# Patient Record
Sex: Female | Born: 1975 | Hispanic: No
Health system: Midwestern US, Community
[De-identification: ages and names within clinical notes are randomized; demographics above are authoritative.]

## PROBLEM LIST (undated history)

## (undated) DIAGNOSIS — F419 Anxiety disorder, unspecified: Secondary | ICD-10-CM

## (undated) DIAGNOSIS — E876 Hypokalemia: Secondary | ICD-10-CM

## (undated) DIAGNOSIS — R768 Other specified abnormal immunological findings in serum: Secondary | ICD-10-CM

## (undated) DIAGNOSIS — I10 Essential (primary) hypertension: Secondary | ICD-10-CM

## (undated) DIAGNOSIS — J0110 Acute frontal sinusitis, unspecified: Secondary | ICD-10-CM

## (undated) DIAGNOSIS — M62838 Other muscle spasm: Secondary | ICD-10-CM

## (undated) DIAGNOSIS — M25512 Pain in left shoulder: Secondary | ICD-10-CM

---

## 2016-01-13 ENCOUNTER — Ambulatory Visit: Admit: 2016-01-13 | Discharge: 2016-01-13 | Payer: PRIVATE HEALTH INSURANCE | Attending: Family Medicine

## 2016-01-13 DIAGNOSIS — F419 Anxiety disorder, unspecified: Secondary | ICD-10-CM

## 2016-01-13 MED ORDER — BUSPIRONE 7.5 MG TAB
7.5 mg | ORAL_TABLET | Freq: Two times a day (BID) | ORAL | 0 refills | Status: DC
Start: 2016-01-13 — End: 2016-08-05

## 2016-01-13 NOTE — Progress Notes (Signed)
Subjective:      Brandi Clark is a 40 y.o. female who presents for evaluation of anxiety.     Anxiety:  Patient is seen for anxiety. Treatment includes SSRI, Paxil, Lexapro and individual therapy.  Reports that she stopped taking her medication since she felt it was not helping.    Ongoing symptoms include: palpitations, sweating, intermittent chest pain, shortness of breath, dizziness, racing thoughts, difficulty concentrating.   She denies: suicidal ideation, homicidal ideation.   Reported side effects from the treatment: none.      ROS:  General/Constitutional:   No headache, fever, fatigue, weight loss or weight gain       Neck:   No swelling, masses, stiffness, pain, or limited movement     Cardiac:    As above   Respiratory:   As above  GI:   No nausea/vomiting, diarrhea, abdominal pain, bloody or dark stools       GU:   No dysuria or  hematuria    Neurological:   No loss of consciousness, dizziness, seizures    PMHx:  Past Medical History:   Diagnosis Date   ??? Anxiety        Meds:       Allergies:   Allergies not on file    Smoker:  History   Smoking Status   ??? Smoker, Current Status Unknown   ??? Packs/day: 1.00   Smokeless Tobacco   ??? Never Used       ETOH:   History   Alcohol Use No       FH:   Family History   Problem Relation Age of Onset   ??? Other Mother      fibroid tumor   ??? No Known Problems Father          Objective:     Visit Vitals   ??? BP (!) 145/97 (BP 1 Location: Left arm, BP Patient Position: Sitting)   ??? Pulse 83   ??? Temp 98.3 ??F (36.8 ??C) (Oral)   ??? Resp 17   ??? Ht 5' 6.61" (1.692 m)   ??? Wt 194 lb (88 kg)   ??? LMP 01/06/2016 (Approximate)   ??? SpO2 98%   ??? BMI 30.74 kg/m2       GEN: No apparent distress.     LUNGS: Respirations unlabored; clear to auscultation bilaterally  CARDIOVASCULAR: Regular, rate, and rhythm without murmurs, gallops or rubs   ABDOMEN: Soft; nontender; nondistended; normoactive bowel sounds; no masses or    organomegaly  SKIN: No obvious rashes.    NEUROLOGIC:  No focal neurologic deficits. Strength and sensation grossly intact.                           Coordination and gait grossly intact.   PSYCH: alert, oriented to person, place, and time, normal mood, behavior, speech, dress, motor activity, and thought processes    Assessment:     40 yo female who comes in for:     ICD-10-CM ICD-9-CM    1. Anxiety F41.9 300.00 busPIRone (BUSPAR) 7.5 mg tablet   2. Tobacco abuse Z72.0 305.1            Plan:   GAD7 score- 18    Anxiety  - Prescribed Buspar 7.5mg  BID  - Will have patient return to the clinic in 2 week  - Referral for cognitive behavioral therapy, stressed importance of making appointment (provided list of providers)  - Instructed  patient to contact office or on-call physician promptly should condition worsen or any new symptoms appear and provided on-call telephone numbers. IF THE PATIENT HAS ANY SUICIDAL OR HOMICIDAL IDEATION, CALL THE OFFICE, DISCUSS WITH A SUPPORT MEMBER OR GO TO THE ER IMMEDIATELY. Patient was agreeable with this plan    -BP elevated today. Likely secondary to anxiety. Counseled patient to keep log and bring to next visit.   Counseled on risks of tobacco abuse and advised patient to quit.     Patient is counseled to return to the office if symptoms do not improve as expected.  Urgent consultation with the nearest Emergency Department is strongly recommended if condition worsens.  Patient is counseled to follow up as recommended and to inform the office if any changes in treatment are recommended.      Patient discussed with Dr. Pryor Ochoa, attending physician        Signed By:  Leanora Ivanoff, MD    Family Medicine Resident

## 2016-01-13 NOTE — Patient Instructions (Signed)
Anxiety Disorder: Care Instructions  Your Care Instructions  Anxiety is a normal reaction to stress. Difficult situations can cause you to have symptoms such as sweaty palms and a nervous feeling.  In an anxiety disorder, the symptoms are far more severe. Constant worry, muscle tension, trouble sleeping, nausea and diarrhea, and other symptoms can make normal daily activities difficult or impossible. These symptoms may occur for no reason, and they can affect your work, school, or social life. Medicines, counseling, and self-care can all help.  Follow-up care is a key part of your treatment and safety. Be sure to make and go to all appointments, and call your doctor if you are having problems. It's also a good idea to know your test results and keep a list of the medicines you take.  How can you care for yourself at home?  ?? Take medicines exactly as directed. Call your doctor if you think you are having a problem with your medicine.  ?? Go to your counseling sessions and follow-up appointments.  ?? Recognize and accept your anxiety. Then, when you are in a situation that makes you anxious, say to yourself, "This is not an emergency. I feel uncomfortable, but I am not in danger. I can keep going even if I feel anxious."  ?? Be kind to your body:  ?? Relieve tension with exercise or a massage.  ?? Get enough rest.  ?? Avoid alcohol, caffeine, nicotine, and illegal drugs. They can increase your anxiety level and cause sleep problems.  ?? Learn and do relaxation techniques. See below for more about these techniques.  ?? Engage your mind. Get out and do something you enjoy. Go to a funny movie, or take a walk or hike. Plan your day. Having too much or too little to do can make you anxious.  ?? Keep a record of your symptoms. Discuss your fears with a good friend or family member, or join a support group for people with similar problems. Talking to others sometimes relieves stress.   ?? Get involved in social groups, or volunteer to help others. Being alone sometimes makes things seem worse than they are.  ?? Get at least 30 minutes of exercise on most days of the week to relieve stress. Walking is a good choice. You also may want to do other activities, such as running, swimming, cycling, or playing tennis or team sports.  Relaxation techniques  Do relaxation exercises 10 to 20 minutes a day. You can play soothing, relaxing music while you do them, if you wish.  ?? Tell others in your house that you are going to do your relaxation exercises. Ask them not to disturb you.  ?? Find a comfortable place, away from all distractions and noise.  ?? Lie down on your back, or sit with your back straight.  ?? Focus on your breathing. Make it slow and steady.  ?? Breathe in through your nose. Breathe out through either your nose or mouth.  ?? Breathe deeply, filling up the area between your navel and your rib cage. Breathe so that your belly goes up and down.  ?? Do not hold your breath.  ?? Breathe like this for 5 to 10 minutes. Notice the feeling of calmness throughout your whole body.  As you continue to breathe slowly and deeply, relax by doing the following for another 5 to 10 minutes:  ?? Tighten and relax each muscle group in your body. You can begin at your toes and work your   way up to your head.  ?? Imagine your muscle groups relaxing and becoming heavy.  ?? Empty your mind of all thoughts.  ?? Let yourself relax more and more deeply.  ?? Become aware of the state of calmness that surrounds you.  ?? When your relaxation time is over, you can bring yourself back to alertness by moving your fingers and toes and then your hands and feet and then stretching and moving your entire body. Sometimes people fall asleep during relaxation, but they usually wake up shortly afterward.  ?? Always give yourself time to return to full alertness before you drive a  car or do anything that might cause an accident if you are not fully alert. Never play a relaxation tape while you drive a car.  When should you call for help?  Call 911 anytime you think you may need emergency care. For example, call if:  ?? You feel you cannot stop from hurting yourself or someone else.  Keep the numbers for these national suicide hotlines: 1-800-273-TALK 709-465-8050) and 1-800-SUICIDE 234-278-4374). If you or someone you know talks about suicide or feeling hopeless, get help right away.  Watch closely for changes in your health, and be sure to contact your doctor if:  ?? You have anxiety or fear that affects your life.  ?? You have symptoms of anxiety that are new or different from those you had before.  Where can you learn more?  Go to [employer].  Enter P754 in the search box to learn more about "Anxiety Disorder: Care Instructions."  Current as of: January 08, 2015  Content Version: 11.3  ?? 2006-2017 [employer]. Care instructions adapted under license by [employer] (which disclaims liability or warranty for this information). If you have questions about a medical condition or this instruction, always ask your healthcare professional. [employer], Inc[employer] disclaims any warranty or liability for your use of this information.    [facility] Board:  [address] [place], [place] [address]  24 hour crisis line: [phone]  Daytime number: [phone]  Psychiatry, counseling, case management, drug/alcohol addiction    [facility] (Dr. Tami Ribas): [email or web address]  [address], [place], [place] [address]  Phone: [phone]) 897-WELL [phone])  Fax: [phone]  Office Hours:  Mon: 10:00 am to 4:00 pm  Tue: 8:00 am to 6:00 pm  Wed-Thur: 8:00 am to 7:00 pm    [facility]: [email or web address]  [place] [phone]),   [place] [phone])  [place] [phone])  [place] [phone])    [facility]  Addiction/Substance abuse   [place]: [phone]  [place]: [phone]    [facility]: [email or web address]  [address], [place], [place] [address]  [address], [place], [place] [address]  Ph. [phone] Fax [phone]    [facility]: [email or web address]  [address] [phone])  [address] [place] [phone])    [facility] - [address] [place]  [address]  [place], [address]  [place], [place] [address]  Phone: [phone]  Fax: [phone]    [facility] - [facility]  [address], [address]  [place], [place] [address]  Phone: [phone]  Fax: [phone]    Lawanna Kobus, M.D.  [place], [place]  [phone]    Rocky Link, M.D.  [place]  [phone]    [facility]  [address]  [place], [place]  [phone]  [phone]

## 2016-01-13 NOTE — Progress Notes (Signed)
I reviewed with the resident the medical history and the resident's findings on the physical examination.  I discussed with the resident the patient's diagnosis and concur with the plan.n as documented in the resident's note.

## 2016-02-04 ENCOUNTER — Encounter: Attending: Family Medicine

## 2016-07-31 ENCOUNTER — Ambulatory Visit: Admit: 2016-07-31 | Discharge: 2016-07-31 | Payer: PRIVATE HEALTH INSURANCE | Attending: Family Medicine

## 2016-07-31 DIAGNOSIS — F419 Anxiety disorder, unspecified: Secondary | ICD-10-CM

## 2016-07-31 MED ORDER — GABAPENTIN 100 MG CAP
100 mg | ORAL_CAPSULE | Freq: Three times a day (TID) | ORAL | 0 refills | Status: DC
Start: 2016-07-31 — End: 2016-08-14

## 2016-07-31 NOTE — Patient Instructions (Addendum)
Anxiety Disorder: Care Instructions  Your Care Instructions    Anxiety is a normal reaction to stress. Difficult situations can cause you to have symptoms such as sweaty palms and a nervous feeling.  In an anxiety disorder, the symptoms are far more severe. Constant worry, muscle tension, trouble sleeping, nausea and diarrhea, and other symptoms can make normal daily activities difficult or impossible. These symptoms may occur for no reason, and they can affect your work, school, or social life. Medicines, counseling, and self-care can all help.  Follow-up care is a key part of your treatment and safety. Be sure to make and go to all appointments, and call your doctor if you are having problems. It's also a good idea to know your test results and keep a list of the medicines you take.  How can you care for yourself at home?  ?? Take medicines exactly as directed. Call your doctor if you think you are having a problem with your medicine.  ?? Go to your counseling sessions and follow-up appointments.  ?? Recognize and accept your anxiety. Then, when you are in a situation that makes you anxious, say to yourself, "This is not an emergency. I feel uncomfortable, but I am not in danger. I can keep going even if I feel anxious."  ?? Be kind to your body:  ?? Relieve tension with exercise or a massage.  ?? Get enough rest.  ?? Avoid alcohol, caffeine, nicotine, and illegal drugs. They can increase your anxiety level and cause sleep problems.  ?? Learn and do relaxation techniques. See below for more about these techniques.  ?? Engage your mind. Get out and do something you enjoy. Go to a funny movie, or take a walk or hike. Plan your day. Having too much or too little to do can make you anxious.  ?? Keep a record of your symptoms. Discuss your fears with a good friend or family member, or join a support group for people with similar problems. Talking to others sometimes relieves stress.   ?? Get involved in social groups, or volunteer to help others. Being alone sometimes makes things seem worse than they are.  ?? Get at least 30 minutes of exercise on most days of the week to relieve stress. Walking is a good choice. You also may want to do other activities, such as running, swimming, cycling, or playing tennis or team sports.  Relaxation techniques  Do relaxation exercises 10 to 20 minutes a day. You can play soothing, relaxing music while you do them, if you wish.  ?? Tell others in your house that you are going to do your relaxation exercises. Ask them not to disturb you.  ?? Find a comfortable place, away from all distractions and noise.  ?? Lie down on your back, or sit with your back straight.  ?? Focus on your breathing. Make it slow and steady.  ?? Breathe in through your nose. Breathe out through either your nose or mouth.  ?? Breathe deeply, filling up the area between your navel and your rib cage. Breathe so that your belly goes up and down.  ?? Do not hold your breath.  ?? Breathe like this for 5 to 10 minutes. Notice the feeling of calmness throughout your whole body.  As you continue to breathe slowly and deeply, relax by doing the following for another 5 to 10 minutes:  ?? Tighten and relax each muscle group in your body. You can begin at your toes and   work your way up to your head.  ?? Imagine your muscle groups relaxing and becoming heavy.  ?? Empty your mind of all thoughts.  ?? Let yourself relax more and more deeply.  ?? Become aware of the state of calmness that surrounds you.  ?? When your relaxation time is over, you can bring yourself back to alertness by moving your fingers and toes and then your hands and feet and then stretching and moving your entire body. Sometimes people fall asleep during relaxation, but they usually wake up shortly afterward.  ?? Always give yourself time to return to full alertness before you drive a  car or do anything that might cause an accident if you are not fully alert. Never play a relaxation tape while you drive a car.  When should you call for help?  Call 911 anytime you think you may need emergency care. For example, call if:  ? ?? You feel you cannot stop from hurting yourself or someone else.   ?Keep the numbers for these national suicide hotlines: 1-800-273-TALK (1-800-273-8255) and 1-800-SUICIDE (1-800-784-2433). If you or someone you know talks about suicide or feeling hopeless, get help right away.  ?Watch closely for changes in your health, and be sure to contact your doctor if:  ? ?? You have anxiety or fear that affects your life.   ? ?? You have symptoms of anxiety that are new or different from those you had before.   Where can you learn more?  Go to http://www.healthwise.net/GoodHelpConnections.  Enter P754 in the search box to learn more about "Anxiety Disorder: Care Instructions."  Current as of: Oct 25, 2015  Content Version: 11.4  ?? 2006-2017 Healthwise, Inc[employer]. Care instructions adapted under license by [employer] (which disclaims liability or warranty for this information). If you have questions about a medical condition or this instruction, always ask your healthcare professional. Healthwise, Incorporated disclaims any warranty or liability for your use of this information.

## 2016-07-31 NOTE — Progress Notes (Signed)
1. Have you been to the ER, urgent care clinic, or been hospitalized since your last visit? Yes. [facility] 3 months ago, Sciatica    2. Have you seen or consulted any other health care providers outside [facility] since your last visit?  No     Reviewed record in preparation for visit and have necessary documentation  opportunity was given for questions  Goals that were addressed and/or need to be completed during or after this appointment include     Health Maintenance Due   Topic Date Due   ??? Pneumococcal 19-64 Medium Risk (1 of 1 - PPSV23) 07/23/1996   ??? DTaP/Tdap/Td series (1 - Tdap) 07/23/1998   ??? PAP AKA CERVICAL CYTOLOGY  07/23/1998   ??? Influenza Age 78 to Adult  01/14/2016

## 2016-07-31 NOTE — Progress Notes (Signed)
Brandi Clark  41 y.o. female  [birth year]  [address]  [place] [address]  [id number]     [facility]: Progress Note       Encounter Date: 07/31/2016    Chief Complaint   Patient presents with   ??? New Patient   ??? Labs     Hepatitis C       History provided by patient  History of Present Illness   Brandi Clark is a 41 y.o. female who presents to clinic today for:      NEW PATIENT  New patient who presents to establish PCP care.   I personally reviewed and updated the patient's medical, surgical, family and social history.    PREVIOUS PRIMARY CARE PROVIDER and SPECIALISTS  None. Patient decided to come to [facility] due to establish care. Has not seen a GP or GYN since the birth of daughter 14 years ago. Marland Kitchen     RECORDS  Provided by patient: none.     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 She is concerned that she may have hepatitis C as a number of people used to use drug with have been diagnosed.   - has hx of Scaitiac and as had taking Mortin 800 mg.   History of anxiety has tried Wellbutrin in the past (gave her shakes.)Took Lexapro for several months with no clinical effective.She has the bottle at home and will bring to next visit.      Health Maintenance  Patient to schedule a well woman visit.   Health Maintenance Due   Topic Date Due   ??? Pneumococcal 19-64 Medium Risk (1 of 1 - PPSV23) 07/23/1996   ??? DTaP/Tdap/Td series (1 - Tdap) 07/23/1998   ??? PAP AKA CERVICAL CYTOLOGY  07/23/1998   ??? Influenza Age 77 to Adult  01/14/2016     Review of Systems   Review of Systems   Constitutional: Negative for chills and fever.   HENT: Negative for congestion and sore throat.    Eyes: Negative for photophobia, discharge and redness.   Respiratory: Negative for cough, shortness of breath and wheezing.    Cardiovascular: Negative for chest pain, palpitations and leg swelling.   Gastrointestinal: Negative for abdominal pain, blood in stool,  constipation, diarrhea, nausea and vomiting.   Genitourinary: Negative for dysuria and urgency.   Musculoskeletal: Positive for joint pain. Negative for falls.   Skin: Negative for itching and rash.   Neurological: Positive for tingling. Negative for dizziness, focal weakness, seizures, loss of consciousness and headaches.   Psychiatric/Behavioral: Negative for depression, hallucinations and substance abuse. The patient is nervous/anxious. The patient does not have insomnia.        Vitals/Objective:     Vitals:    07/31/16 1048 07/31/16 1055   BP: (!) 163/100 (!) 143/98   Pulse: 77 79   Resp: 18    Temp: 98.1 ??F (36.7 ??C)    TempSrc: Oral    SpO2: 98%    Weight: 173 lb (78.5 kg)    Height: 5\' 6"  (1.676 m)      Body mass index is 27.92 kg/(m^2).    Wt Readings from Last 3 Encounters:   07/31/16 173 lb (78.5 kg)   01/13/16 194 lb (88 kg)       Physical Exam   Constitutional: She is oriented to person, place, and time. She  appears well-nourished. No distress.   HENT:   Head: Normocephalic and atraumatic.   Right Ear: External ear normal.   Left Ear: External ear normal.   Eyes: Conjunctivae are normal. Pupils are equal, round, and reactive to light.   Neck: Normal range of motion. Neck supple. No thyromegaly present.   Cardiovascular: Normal rate and regular rhythm.    No murmur heard.  Pulmonary/Chest: Effort normal and breath sounds normal. No respiratory distress. She has no wheezes. She has no rales.   Abdominal: Soft. Bowel sounds are normal. She exhibits no distension. There is no tenderness. There is no rebound and no guarding.   Neurological: She is alert and oriented to person, place, and time.   Skin: Skin is warm. She is not diaphoretic. No erythema.   MSK:    Posture: Normal   Deformity: None    ROM:     Flexion: Normal    Extension: Normal     Lateral bending: Normal      Gait: Normal       Palpation:    L1-L5: No tenderness    Sacrum: No tenderness    Coccyx: No tenderness     Left Paraspinal: No tenderness    Right Paraspinal: No tenderness     Strength (0-5/5)    Hip Flexion:   Left: 5/5  Right: 5/5    Hip Extension:  Left: 5/5  Right: 5/5    Hip Abduction:  Left: 5/5  Right: 5/5    Hip Adduction:  Left: 5/5  Right: 5/5    Knee Extension:  Left: 5/5  Right: 5/5    Knee Flexion:   Left: 5/5  Right: 5/5    Ankle dorsiflexion:  Left: 5/5  Right: 5/5    Ankle plantarflexion:  Left: 5/5  Right: 5/5    Great toe extension:  Left: 5/5  Right: 5/5     Sensation: Intact, no deficits      DTR:    Patella:  Left: +2  Right: +2    Achilles:  Left: +2  Right: +2     Special test:    Straight leg: Left: positive  Right: Negative           No results found for this or any previous visit (from the past 24 hour(s)).  Assessment and Plan:     Encounter Diagnoses     ICD-10-CM ICD-9-CM   1. Anxiety F41.9 300.00   2. Elevated blood pressure reading without diagnosis of hypertension R03.0 796.2   3. Family planning counseling Z30.09 V25.09   4. Sciatica, left side M54.32 724.3   5. Tobacco abuse Z72.0 305.1       1. Anxiety  Patient reports that she has tried numerous medications in the past for anxiety and never had a medication work for her. She will bring all prior bottle of medications to the next visit.     2. Elevated blood pressure reading without diagnosis of hypertension  Will check CMP and TSH. Close monitoring of BP in the future. Pain may be contributing.   - METABOLIC PANEL, COMPREHENSIVE  - LIPID PANEL  - TSH 3RD GENERATION    3. Family planning counseling  Screening for past IVDU  - HEPATITIS C AB  - HIV 1/2 AG/AB, 4TH GENERATION,W RFLX CONFIRM  - T PALLIDUM SCREEN W/REFLEX    4. Sciatica, left side  Start on low dose gabapentin as NSAIDs not providing enough relief.   - REFERRAL TO PHYSICAL THERAPY  -  gabapentin (NEURONTIN) 100 mg capsule; Take 1 Cap by mouth three (3) times daily. Indications: NEUROPATHIC PAIN  Dispense: 90 Cap; Refill: 0    5. Tobacco abuse   The patient was counseled on the dangers of tobacco use, and was advised to quit and referred to a tobacco cessation program.  Reviewed strategies to maximize success, including removing cigarettes and smoking materials from environment, stress management, support of family/friends and written materials.Patient has decided that she wants to try going "cold [place]" as that is how she successfully quit using IV drug in the past.       I have discussed the diagnosis with the patient and the intended plan as seen in the above orders.  she has expressed understanding.  The patient has received an after-visit summary and questions were answered concerning future plans.  I have discussed medication side effects and warnings with the patient as well.    Electronically Signed: Bonne Dolores, MD     History/Allergies   Patients past medical, surgical and family histories were reviewed and updated.    Past Medical History:   Diagnosis Date   ??? Anxiety    ??? 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       Past Surgical History:   Procedure Laterality Date   ??? HX TUBAL LIGATION Bilateral 2003     Family History   Problem Relation Age of Onset   ??? Other Mother      fibroid tumor   ??? No Known Problems Father      Social History     Social History   ??? Marital status: DIVORCED     Spouse name: N/A   ??? Number of children: N/A   ??? Years of education: N/A     Occupational History   ??? Not on file.     Social History Main Topics   ??? Smoking status: Smoker, Current Status Unknown     Packs/day: 1.00   ??? Smokeless tobacco: Never Used   ??? Alcohol use No   ??? Drug use: No   ??? Sexual activity: Yes     Partners: Male     Other Topics Concern   ??? Not on file     Social History Narrative         Allergies   Allergen Reactions   ??? Codeine Rash and Nausea Only       Disposition     Follow-up Disposition:  Return in about 2 weeks (around 08/14/2016) for Well Woman/Pap smear.    No future appointments.         Current Medications after this visit      Current Outpatient Prescriptions   Medication Sig   ??? ibuprofen (MOTRIN) 800 mg tablet Take 800 mg by mouth every four (4) hours.   ??? phenylephrine hcl (SUPHEDRINE PE) 10 mg tab Take  by mouth every four (4) hours.   ??? gabapentin (NEURONTIN) 100 mg capsule Take 1 Cap by mouth three (3) times daily. Indications: NEUROPATHIC PAIN   ??? busPIRone (BUSPAR) 7.5 mg tablet Take 1 Tab by mouth two (2) times a day.     No current facility-administered medications for this visit.      There are no discontinued medications.

## 2016-08-03 ENCOUNTER — Telehealth

## 2016-08-03 NOTE — Telephone Encounter (Signed)
Called patient and verified ID. Informed patient of test results including positive Hep C screening. Patient was aware that it could be positive and would like to follow up with a specialist in [place] if possible.    Bonne DoloresKimberly A Nicko Daher, MD

## 2016-08-04 LAB — HEPATITIS C ANTIBODY: HCV Ab: >11 s/co ratio — ABNORMAL HIGH (ref 0.0–0.9)

## 2016-08-04 LAB — HIV 1/2 ANTIGEN/ANTIBODY, FOURTH GENERATION W/RFL: HIV Screen 4th Generation wRfx: NONREACTIVE

## 2016-08-04 LAB — METABOLIC PANEL, COMPREHENSIVE
A-G Ratio: 2 (ref 1.2–2.2)
ALT (SGPT): 9 IU/L (ref 0–32)
AST (SGOT): 16 IU/L (ref 0–40)
Albumin: 4.6 g/dL (ref 3.5–5.5)
Alk. phosphatase: 47 IU/L (ref 39–117)
BUN/Creatinine ratio: 18 (ref 9–23)
BUN: 12 mg/dL (ref 6–20)
Bilirubin, total: <0.2 mg/dL (ref 0.0–1.2)
CO2: 23 mmol/L (ref 18–29)
Calcium: 9.6 mg/dL (ref 8.7–10.2)
Chloride: 102 mmol/L (ref 96–106)
Creatinine: 0.66 mg/dL (ref 0.57–1.00)
GFR est AA: 129 mL/min/{1.73_m2} (ref >59)
GFR est non-AA: 112 mL/min/{1.73_m2} (ref >59)
GLOBULIN, TOTAL: 2.3 g/dL (ref 1.5–4.5)
Glucose: 72 mg/dL (ref 65–99)
Potassium: 4.1 mmol/L (ref 3.5–5.2)
Protein, total: 6.9 g/dL (ref 6.0–8.5)
Sodium: 140 mmol/L (ref 134–144)

## 2016-08-04 LAB — LIPID PANEL
Cholesterol, total: 178 mg/dL (ref 100–199)
HDL Cholesterol: 43 mg/dL (ref >39)
LDL, calculated: 104 mg/dL — ABNORMAL HIGH (ref 0–99)
Triglyceride: 154 mg/dL — ABNORMAL HIGH (ref 0–149)
VLDL, calculated: 31 mg/dL (ref 5–40)

## 2016-08-04 LAB — T PALLIDUM SCREEN W/REFLEX: T PALLIDUM AB: NEGATIVE

## 2016-08-04 LAB — TSH 3RD GENERATION: TSH: 1 u[IU]/mL (ref 0.450–4.500)

## 2016-08-04 LAB — HIV 1/2 AG/AB, 4TH GENERATION,W RFLX CONFIRM: HIV SCREEN 4TH GENERATION WRFX: NONREACTIVE

## 2016-08-04 LAB — HEPATITIS C AB: Hep C Virus Ab: >11 s/co ratio — ABNORMAL HIGH (ref 0.0–0.9)

## 2016-08-14 ENCOUNTER — Ambulatory Visit: Admit: 2016-08-14 | Discharge: 2016-08-14 | Payer: PRIVATE HEALTH INSURANCE | Attending: Family Medicine

## 2016-08-14 DIAGNOSIS — Z01419 Encounter for gynecological examination (general) (routine) without abnormal findings: Secondary | ICD-10-CM

## 2016-08-14 NOTE — Progress Notes (Signed)
Brandi Clark  41 y.o. female  April 04, 1976  [address]  [place] [address]  [id number]     [facility]: Progress Note       Encounter Date: 08/14/2016    Chief Complaint   Patient presents with   ??? Gyn Exam   ??? Leg Pain     swelling, bruising       History provided by patient  History of Present Illness   Brandi Clark is a 41 y.o. female who presents to clinic today for:    XBJ:[id number]'O last menstrual period was 08/08/2016.  Menses:Regular cycles.   G3 P2013  Method of protection: none.     PAP History:   Patient does not recall but belives it was normal.     Colonoscopy:  n/a  Mammogram:   Menarche:  12.    LMP: Patient's last menstrual period was 08/08/2016.  # of Children:  G3 P2013  Age at Delivery of First Child: 5.    Hysterectomy/oophorectomy:  No/No.    Breast Bx: No.    Hx of Breast Feeding:  no.    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   Hormone therapy: No.   FamHx of breast ca in first degree relative: No    DEXA: N/A      Health Maintenance  Pap today.   Health Maintenance Due   Topic Date Due   ??? Pneumococcal 19-64 Medium Risk (1 of 1 - PPSV23) 07/23/1996   ??? DTaP/Tdap/Td series (1 - Tdap) 07/23/1998   ??? PAP AKA CERVICAL CYTOLOGY  07/23/1998   ??? Influenza Age 72 to Adult  01/14/2016     Review of Systems   Review of Systems   Constitutional: Negative for chills, fever and weight loss.   Cardiovascular: Negative for chest pain, palpitations and leg swelling.   Gastrointestinal: Negative for abdominal pain, constipation, diarrhea, nausea and vomiting.   Genitourinary: Negative for dysuria, frequency and urgency.   Skin: Negative for itching and rash.   Neurological: Negative for dizziness, focal weakness and headaches.       Vitals/Objective:     Vitals:    08/14/16 0937   BP: 129/59   Pulse: 76   Resp: 16   Temp: 98.1 ??F (36.7 ??C)   TempSrc: Oral   SpO2: 98%   Weight: 180 lb (81.6 kg)   Height: 5\' 6"  (1.676 m)     Body mass index is 29.05 kg/(m^2).     Wt Readings from Last 3 Encounters:   08/14/16 180 lb (81.6 kg)   07/31/16 173 lb (78.5 kg)   01/13/16 194 lb (88 kg)       Physical Exam   Constitutional: She appears well-developed and well-nourished.   Cardiovascular: Normal rate and regular rhythm.    No murmur heard.  Pulmonary/Chest: Effort normal and breath sounds normal. No respiratory distress. She has no wheezes. She has no rales.   Genitourinary: No breast swelling, tenderness, discharge or bleeding. There is no rash, tenderness, lesion or injury on the right labia. There is no rash, tenderness, lesion or injury on the left labia. No erythema, tenderness or bleeding in the vagina. No foreign body in the vagina. No signs of injury around the vagina. No vaginal discharge found.   Genitourinary Comments: Chaperoned by Lilia Argue, LPN   Musculoskeletal: Normal range of motion. She exhibits no edema or tenderness.   Neurological: She is alert.       No  results found for this or any previous visit (from the past 24 hour(s)).  Assessment and Plan:     Encounter Diagnoses     ICD-10-CM ICD-9-CM   1. Well woman exam with routine gynecological exam Z01.419 V72.31   2. Screening for malignant neoplasm of cervix Z12.4 V76.2       1. Well woman exam with routine gynecological exam  - CT/NG/T.VAGINALIS AMPLIFICATION    2. Screening for malignant neoplasm of cervix  - PAP IG, APTIMA HPV AND RFX 16/18,45 ([id number])    I have discussed the diagnosis with the patient and the intended plan as seen in the above orders.  she has expressed understanding.  The patient has received an after-visit summary and questions were answered concerning future plans.  I have discussed medication side effects and warnings with the patient as well.    Electronically Signed: Bonne DoloresKimberly A Ramel Tobon, MD     History/Allergies   Patients past medical, surgical and family histories were reviewed and updated.    Past Medical History:   Diagnosis Date   ??? Anxiety    ??? IV drug user      Has not used in years.       Past Surgical History:   Procedure Laterality Date   ??? HX TUBAL LIGATION Bilateral 2003     Family History   Problem Relation Age of Onset   ??? Other Mother      fibroid tumor   ??? No Known Problems Father      Social History     Social History   ??? Marital status: DIVORCED     Spouse name: N/A   ??? Number of children: N/A   ??? Years of education: N/A     Occupational History   ??? Not on file.     Social History Main Topics   ??? Smoking status: Smoker, Current Status Unknown     Packs/day: 1.00   ??? Smokeless tobacco: Never Used   ??? Alcohol use No   ??? Drug use: No   ??? Sexual activity: Yes     Partners: Male     Other Topics Concern   ??? Not on file     Social History Narrative    Breast Cancer Risk Factors update 08/14/16    # of Children:  G3 P2013    Age at Delivery of First Child: 24.      Hysterectomy/oophorectomy:  No/No.      Breast Bx: No.      Hx of Breast Feeding:  no.      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     Hormone therapy: No.     FamHx of breast ca in first degree relative: No         Allergies   Allergen Reactions   ??? Codeine Rash and Nausea Only       Disposition     Follow-up Disposition:  Return in about 1 year (around 08/14/2017) for Wellness. 6 months for routine. .    Future Appointments  Date Time Provider Department Center   02/16/2017 9:10 AM Bonne DoloresKimberly A Gedalia Mcmillon, MD [facility] SCHED            Current Medications after this visit     Current Outpatient Prescriptions   Medication Sig   ??? ibuprofen (MOTRIN) 800 mg tablet Take 800 mg by mouth every four (4) hours.   ??? phenylephrine hcl (SUPHEDRINE PE) 10 mg tab Take  by  mouth every four (4) hours.     No current facility-administered medications for this visit.      Medications Discontinued During This Encounter   Medication Reason   ??? gabapentin (NEURONTIN) 100 mg capsule Not A Current Medication

## 2016-08-14 NOTE — Patient Instructions (Addendum)
Well Visit, Ages 18 to 50: Care Instructions  Your Care Instructions    Physical exams can help you stay healthy. Your doctor has checked your overall health and may have suggested ways to take good care of yourself. He or she also may have recommended tests. At home, you can help prevent illness with healthy eating, regular exercise, and other steps.  Follow-up care is a key part of your treatment and safety. Be sure to make and go to all appointments, and call your doctor if you are having problems. It's also a good idea to know your test results and keep a list of the medicines you take.  How can you care for yourself at home?  ?? Reach and stay at a healthy weight. This will lower your risk for many problems, such as obesity, diabetes, heart disease, and high blood pressure.  ?? Get at least 30 minutes of physical activity on most days of the week. Walking is a good choice. You also may want to do other activities, such as running, swimming, cycling, or playing tennis or team sports. Discuss any changes in your exercise program with your doctor.  ?? Do not smoke or allow others to smoke around you. If you need help quitting, talk to your doctor about stop-smoking programs and medicines. These can increase your chances of quitting for good.  ?? Talk to your doctor about whether you have any risk factors for sexually transmitted infections (STIs). Having one sex partner (who does not have STIs and does not have sex with anyone else) is a good way to avoid these infections.  ?? Use birth control if you do not want to have children at this time. Talk with your doctor about the choices available and what might be best for you.  ?? Protect your skin from too much sun. When you're outdoors from 10 a.m. to 4 p.m., stay in the shade or cover up with clothing and a hat with a wide brim. Wear sunglasses that block UV rays. Even when it's cloudy, put broad-spectrum sunscreen (SPF 30 or higher) on any exposed skin.   ?? See a dentist one or two times a year for checkups and to have your teeth cleaned.  ?? Wear a seat belt in the car.  ?? Drink alcohol in moderation, if at all. That means no more than 2 drinks a day for men and 1 drink a day for women.  Follow your doctor's advice about when to have certain tests. These tests can spot problems early.  For everyone  ?? Cholesterol. Have the fat (cholesterol) in your blood tested after age 20. Your doctor will tell you how often to have this done based on your age, family history, or other things that can increase your risk for heart disease.  ?? Blood pressure. Have your blood pressure checked during a routine doctor visit. Your doctor will tell you how often to check your blood pressure based on your age, your blood pressure results, and other factors.  ?? Vision. Talk with your doctor about how often to have a glaucoma test.  ?? Diabetes. Ask your doctor whether you should have tests for diabetes.  ?? Colon cancer. Have a test for colon cancer at age 50. You may have one of several tests. If you are younger than 50, you may need a test earlier if you have any risk factors. Risk factors include whether you already had a precancerous polyp removed from your colon or whether your   parent, brother, sister, or child has had colon cancer.  For women  ?? Breast exam and mammogram. Talk to your doctor about when you should have a clinical breast exam and a mammogram. Medical experts differ on whether and how often women under 50 should have these tests. Your doctor can help you decide what is right for you.  ?? Pap test and pelvic exam. Begin Pap tests at age 21. A Pap test is the best way to find cervical cancer. The test often is part of a pelvic exam. Ask how often to have this test.  ?? Tests for sexually transmitted infections (STIs). Ask whether you should have tests for STIs. You may be at risk if you have sex with more than one person, especially if your partners do not wear condoms.   For men  ?? Tests for sexually transmitted infections (STIs). Ask whether you should have tests for STIs. You may be at risk if you have sex with more than one person, especially if you do not wear a condom.  ?? Testicular cancer exam. Ask your doctor whether you should check your testicles regularly.  ?? Prostate exam. Talk to your doctor about whether you should have a blood test (called a PSA test) for prostate cancer. Experts differ on whether and when men should have this test. Some experts suggest it if you are older than 45 and are African-American or have a father or brother who got prostate cancer when he was younger than 65.  When should you call for help?  Watch closely for changes in your health, and be sure to contact your doctor if you have any problems or symptoms that concern you.  Where can you learn more?  Go to http://www.healthwise.net/GoodHelpConnections.  Enter P072 in the search box to learn more about "Well Visit, Ages 18 to 50: Care Instructions."  Current as of: Oct 25, 2015  Content Version: 11.4  ?? 2006-2017 [employer]. Care instructions adapted under license by [employer] (which disclaims liability or warranty for this information). If you have questions about a medical condition or this instruction, always ask your healthcare professional. Healthwise, Incorporated disclaims any warranty or liability for your use of this information.       Pap Test: Care Instructions  Your Care Instructions    The Pap test (also called a Pap smear) is a screening test for cancer of the cervix, which is the lower part of the uterus that opens into the vagina. The test can help your doctor find early changes in the cells that could lead to cancer.  The sample of cells taken during your test has been sent to a lab so that an expert can look at the cells. It usually takes a week or two to get the results back.  Follow-up care is a key part of your treatment and safety. Be sure to make  and go to all appointments, and call your doctor if you are having problems. It's also a good idea to know your test results and keep a list of the medicines you take.  What do the results mean?  ?? A normal result means that the test did not find any abnormal cells in the sample.  ?? An abnormal result can mean many things. Most of these are not cancer. The results of your test may be abnormal because:  ?? You have an infection of the vagina or cervix, such as a yeast infection.  ?? You have an   IUD (intrauterine device for birth control).  ?? You have low estrogen levels after menopause that are causing the cells to change.  ?? You have cell changes that may be a sign of precancer or cancer. The results are ranked based on how serious the changes might be.  There are many other reasons why you might not get a normal result. If the results were abnormal, you may need to get another test within a few weeks or months. If the results show changes that could be a sign of cancer, you may need a test called a colposcopy, which provides a more complete view of the cervix.  Sometimes the lab cannot use the sample because it does not contain enough cells or was not preserved well. If so, you may need to have the test again. This is not common, but it does happen from time to time.  When should you call for help?  Watch closely for changes in your health, and be sure to contact your doctor if:  ? ?? You have vaginal bleeding or pain for more than 2 days after the test. It is normal to have a small amount of bleeding for a day or two after the test.   Where can you learn more?  Go to http://www.healthwise.net/GoodHelpConnections.  Enter U972 in the search box to learn more about "Pap Test: Care Instructions."  Current as of: Oct 25, 2015  Content Version: 11.4  ?? 2006-2017 [employer]. Care instructions adapted under license by [employer] (which disclaims liability or warranty  for this information). If you have questions about a medical condition or this instruction, always ask your healthcare professional. Healthwise, Inc[employer] disclaims any warranty or liability for your use of this information.

## 2016-08-14 NOTE — Progress Notes (Signed)
1. Have you been to the ER, urgent care clinic, or been hospitalized since your last visit? No     2. Have you seen or consulted any other health care providers outside of the [facility] since your last visit?  No       Reviewed record in preparation for visit and have necessary documentation  opportunity was given for questions  Goals that were addressed and/or need to be completed during or after this appointment include     Health Maintenance Due   Topic Date Due   ??? Pneumococcal 19-64 Medium Risk (1 of 1 - PPSV23) 07/23/1996   ??? DTaP/Tdap/Td series (1 - Tdap) 07/23/1998   ??? PAP AKA CERVICAL CYTOLOGY  07/23/1998   ??? Influenza Age 72 to Adult  01/14/2016

## 2016-08-16 LAB — CT/NG/T.VAGINALIS AMPLIFICATION
C. trachomatis by NAA: NEGATIVE
N. gonorrhoeae by NAA: NEGATIVE
T. vaginalis by NAA: NEGATIVE

## 2016-08-20 LAB — PAP IG, APTIMA HPV AND RFX 16/18,45 (199305)
HPV Aptima: NEGATIVE
LABCORP 019018: 0

## 2016-08-20 LAB — PAP IG, APTIMA HPV AND RFX 16/18,45 (507805)
.: 0
HPV APTIMA: NEGATIVE

## 2016-09-09 NOTE — Telephone Encounter (Signed)
-----   Message from Robert BellowSharkeisha M Clarke sent at 09/09/2016  3:34 PM EDT -----  Regarding: Dr.Hlavack/Telephone  Pt is [facility]  in reference to her pain level. Best contact number is [phone].

## 2016-09-09 NOTE — Telephone Encounter (Signed)
Returned call to patient. States she has sciatic pain and currently goes to physical therapy but pain is severe today. Advised Dr. Dorita FrayHlavac is out until April 9th. Offered an appointment with another physician. Patient declined. States she will suffer with the pain and go to ER if needed.

## 2016-12-29 ENCOUNTER — Ambulatory Visit: Admit: 2016-12-29 | Discharge: 2016-12-29 | Payer: PRIVATE HEALTH INSURANCE | Attending: Family Medicine

## 2016-12-29 DIAGNOSIS — G8929 Other chronic pain: Secondary | ICD-10-CM

## 2016-12-29 MED ORDER — HYDROCODONE-ACETAMINOPHEN 7.5 MG-325 MG TAB
ORAL_TABLET | Freq: Four times a day (QID) | ORAL | 0 refills | Status: DC | PRN
Start: 2016-12-29 — End: 2017-01-05

## 2016-12-29 NOTE — Progress Notes (Signed)
Brandi Clark  41 y.o. female  [birth year]  [address]  [place] [address]  [id number]     [facility]: Progress Note       Encounter Date: 12/29/2016    Chief Complaint   Patient presents with   ??? Anxiety   ??? Back Pain     sciatic pain down left leg       History provided by patient and daughter  History of Present Illness   Brandi Clark is a 41 y.o. female who presents to clinic today for:    Back Pain and left knee pain  Patient present with cc of back pain. Has a history of sciatica which she has been using IBU 800 mg for.  Had been prescribed gabapentin on 07/28/2016 without any decrease in pain as well as PT which helped in the short term. Has also tried flexeril without any response.   - She has seen Dr. 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 She is also having difficulties with her school work due to the pain interfering as well as sleep.   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 Reports that she has been self-medicating with marijuana and benzo which she gets from a friend.       Anxiety Review:  Patient is seen for anxiety disorder. Current treatment includes none and no other therapies.   Prior treatments: Wellbutrin in the past (gave her shakes.)Took Lexapro for several months with no clinical effectiveness. States she has taken "all antidepressants." Does not recall name of the clinic where she was treated in [place] but will call with the name.   Ongoig symptoms include: anhedonia and decreased sleep palpitations, chest pain, insomnia, racing thoughts, psychomotor agitation, difficulty concentrating.  Patient denies: suicidal ideation homocidal ideation.  Reported side effects from the treatment: n/a      Review of Systems   Review of Systems   Constitutional: Negative for chills and fever.   Genitourinary: Negative for dysuria, frequency and urgency.    Musculoskeletal: Positive for back pain and joint pain. Negative for myalgias.   Skin: Negative for itching and rash.   Neurological: Negative for dizziness and headaches.   Psychiatric/Behavioral: Positive for depression and hallucinations. Negative for memory loss, substance abuse and suicidal ideas. The patient is nervous/anxious and has insomnia.         Vitals/Objective:     Vitals:    12/29/16 0849 12/29/16 0852   BP: (!) 181/117 (!) 153/103   Pulse: 99 98   Resp: 18    Temp: 98.1 ??F (36.7 ??C)    TempSrc: Oral    SpO2: 98%    Weight: 169 lb (76.7 kg)    Height: 5\' 6"  (1.676 m)      Body mass index is 27.28 kg/(m^2).    Wt Readings from Last 3 Encounters:   12/29/16 169 lb (76.7 kg)   08/14/16 180 lb (81.6 kg)   07/31/16 173 lb (78.5 kg)       Physical Exam   Constitutional: She appears well-developed and well-nourished. She appears distressed.   HENT:   Head: Normocephalic and atraumatic.   Cardiovascular: Normal rate and regular rhythm.    Pulmonary/Chest: Effort normal and breath sounds normal.   Skin: Skin is warm and dry.    MSK:  Posture: normal. SItting on right hip only   Deformity: None    ROM:     Flexion: Normal    Extension: Normal     Lateral bending: Normal      Gait: ataxic      Palpation:    L1-L5:+ tenderness    Sacrum: No tenderness    Coccyx: No tenderness    Left Paraspinal:+ tenderness    Right Paraspinal:+ tenderness     Strength (0-5/5)    Hip Flexion:   Left: 3/5  Right: 5/5    Hip Extension:  Left: 3/5  Right: 5/5    Hip Abduction:  Left: 4/5  Right: 5/5    Hip Adduction:  Left: 3/5  Right: 5/5    Knee Extension:  Left: 3/5  Right: 4/5    Knee Flexion:   Left: 3/5  Right: 5/5    Ankle dorsiflexion:  Left: 5/5  Right: 5/5    Ankle plantarflexion:  Left: 5/5  Right: 5/5    Great toe extension:  Left: 5/5  Right: 5/5     Sensation: Intact, no deficits      DTR:    Patella:  Left: +2  Right: +2    Achilles:  Left: +2  Right: +2     Special test:     Straight leg: Left: Positive  Right: Negative    Pearlean Brownie???s: Left: Negative  Right: Negative    Piriformis: Left: Negative  Right: Negative          No results found for this or any previous visit (from the past 24 hour(s)).  Assessment and Plan:     Encounter Diagnoses     ICD-10-CM ICD-9-CM   1. Chronic bilateral low back pain with left-sided sciatica M54.42 724.2    G89.29 724.3     338.29   2. Chronic pain of left knee M25.562 719.46    G89.29 338.29   3. Anxiety F41.9 300.00   4. Mixed hyperlipidemia E78.2 272.2   5. Essential hypertension I10 401.9       1. Chronic bilateral low back pain with left-sided sciatica  2. Chronic pain of left knee  Patient and I discussed agreement and she agreed and signed it. UDS today. Close follow up in 1 week. Patient has high risk based on hx of drug abuse.   - TOXASSURE SELECT 13 (MW)  - HYDROcodone-acetaminophen (NORCO) 7.5-325 mg per tablet; Take 1 Tab by mouth every six (6) hours as needed for Pain. Max Daily Amount: 4 Tabs.  Dispense: 56 Tab; Refill: 0    3. Anxiety  Patient to call with name of clinic where she was treated in the past. Advised that I will not treat with narcotic and benzo at the same time.   - TOXASSURE SELECT 13 (MW)    4. Mixed hyperlipidemia  - LIPID PANEL    5. Essential hypertension  Pain is contributing will monitor as pain is treated.  - METABOLIC PANEL, BASIC    I have discussed the diagnosis with the patient and the intended plan as seen in the above orders.  she has expressed understanding.  The patient has received an after-visit summary and questions were answered concerning future plans.  I have discussed medication side effects and warnings with the patient as well.    Electronically Signed: Bonne Dolores, MD     History/Allergies   Patients past medical, surgical and family histories were reviewed and updated.    Past Medical History:  Diagnosis Date   ??? Anxiety    ??? 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        Past Surgical History:   Procedure Laterality Date   ??? HX TUBAL LIGATION Bilateral 2003     Family History   Problem Relation Age of Onset   ??? Other Mother      fibroid tumor   ??? No Known Problems Father      Social History     Social History   ??? Marital status: DIVORCED     Spouse name: N/A   ??? Number of children: N/A   ??? Years of education: N/A     Occupational History   ??? Not on file.     Social History Main Topics   ??? Smoking status: Smoker, Current Status Unknown     Packs/day: 1.00   ??? Smokeless tobacco: Never Used   ??? Alcohol use No   ??? Drug use: No   ??? Sexual activity: Yes     Partners: Male     Other Topics Concern   ??? Not on file     Social History Narrative    Breast Cancer Risk Factors update 08/14/16    # of Children:  G3 P2013    Age at Delivery of First Child: 24.      Hysterectomy/oophorectomy:  No/No.      Breast Bx: No.      Hx of Breast Feeding:  no.      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     Hormone therapy: No.     FamHx of breast ca in first degree relative: No         Allergies   Allergen Reactions   ??? Codeine Rash and Nausea Only       Disposition     Follow-up Disposition:  Return in about 1 week (around 01/05/2017) for Routine.    Future Appointments  Date Time Provider Department Center   02/17/2017 9:10 AM Bonne DoloresKimberly A Jaquay Morneault, MD [facility] SCHED            Current Medications after this visit     Current Outpatient Prescriptions   Medication Sig   ??? HYDROcodone-acetaminophen (NORCO) 7.5-325 mg per tablet Take 1 Tab by mouth every six (6) hours as needed for Pain. Max Daily Amount: 4 Tabs.   ??? ibuprofen (MOTRIN) 800 mg tablet Take 800 mg by mouth every four (4) hours.   ??? phenylephrine hcl (SUPHEDRINE PE) 10 mg tab Take  by mouth every four (4) hours.     No current facility-administered medications for this visit.      There are no discontinued medications.

## 2016-12-29 NOTE — Progress Notes (Signed)
1. Have you been to the ER, urgent care clinic, or been hospitalized since your last visit?  No     2. Have you seen or consulted any other health care providers outside of the [facility] since your last visit? No     Reviewed record in preparation for visit and have necessary documentation  opportunity was given for questions  Goals that were addressed and/or need to be completed during or after this appointment include     Health Maintenance Due   Topic Date Due   ??? Pneumococcal 19-64 Medium Risk (1 of 1 - PPSV23) 07/23/1996   ??? DTaP/Tdap/Td series (1 - Tdap) 07/23/1998

## 2016-12-29 NOTE — Telephone Encounter (Signed)
Pharmacy called stating patient insurance requires prior authorization for Norco. Patient did not want to wait for prior auth to be completed. Patient paid cash for 7 day supply (28 tablets).  Prior auth started on this day. Awaiting approval at this time. Patient has a signed pain contract with physician.

## 2016-12-30 NOTE — Telephone Encounter (Addendum)
Norco 7.5/325mg     Prior authorization approved for quantity of 56 per 14 days through 06/27/2017    Auth request #[id number]  Approved code: [id number]

## 2017-01-05 ENCOUNTER — Ambulatory Visit: Admit: 2017-01-05 | Discharge: 2017-01-05 | Payer: PRIVATE HEALTH INSURANCE | Attending: Family Medicine

## 2017-01-05 DIAGNOSIS — G8929 Other chronic pain: Secondary | ICD-10-CM

## 2017-01-05 LAB — METABOLIC PANEL, BASIC
BUN/Creatinine ratio: 24 — ABNORMAL HIGH (ref 9–23)
BUN: 18 mg/dL (ref 6–20)
CO2: 21 mmol/L (ref 20–29)
Calcium: 9.2 mg/dL (ref 8.7–10.2)
Chloride: 106 mmol/L (ref 96–106)
Creatinine: 0.75 mg/dL (ref 0.57–1.00)
GFR est AA: 116 mL/min/{1.73_m2} (ref >59)
GFR est non-AA: 101 mL/min/{1.73_m2} (ref >59)
Glucose: 80 mg/dL (ref 65–99)
Potassium: 4.3 mmol/L (ref 3.5–5.2)
Sodium: 142 mmol/L (ref 134–144)

## 2017-01-05 LAB — LIPID PANEL
Cholesterol, total: 135 mg/dL (ref 100–199)
HDL Cholesterol: 38 mg/dL — ABNORMAL LOW (ref >39)
LDL, calculated: 76 mg/dL (ref 0–99)
Triglyceride: 105 mg/dL (ref 0–149)
VLDL, calculated: 21 mg/dL (ref 5–40)

## 2017-01-05 LAB — TOXASSURE SELECT 13 (MW)

## 2017-01-05 MED ORDER — HYDROCODONE-ACETAMINOPHEN 7.5 MG-325 MG TAB
ORAL_TABLET | Freq: Four times a day (QID) | ORAL | 0 refills | Status: DC | PRN
Start: 2017-01-05 — End: 2017-01-05

## 2017-01-05 MED ORDER — HYDROCODONE-ACETAMINOPHEN 7.5 MG-325 MG TAB
ORAL_TABLET | Freq: Four times a day (QID) | ORAL | 0 refills | Status: DC | PRN
Start: 2017-01-05 — End: 2017-02-02

## 2017-01-05 NOTE — Progress Notes (Signed)
Brandi Clark  41 y.o. female  10/08/1975  [address]  [place] [id number]  [id number]     [facility]: Progress Note       Encounter Date: 01/05/2017    Chief Complaint   Patient presents with   ??? Medication Refill   ??? Cold Symptoms       History provided by patient  History of Present Illness   Brandi Clark is a 41 y.o. female who presents to clinic today for:    Back Pain and left knee pain  History of pain  12/26/2016: "Patient present with cc of back pain. Has a history of sciatica which she has been using IBU 800 mg for.  Had been prescribed gabapentin on 07/28/2016 without any decrease in pain as well as PT which helped in the short term. Has also tried flexeril without any response.   - She has seen Dr. 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 She is also having difficulties with her school work due to the pain interfering as well as sleep.   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 Reports that she has been self-medicating with marijuana and benzo which she gets from a friend. "    Today patient reports that with the medication her pain was lowered to 5-6 in the left hip and back and no knee pain at all.     Patient UDS was positive for alprazolam and THC. She did not bring her pill bottle to the appointment.       Cold Symptoms  Patient reports that she has been having rhinorrhea, nasal congestion, and cough.     Review of Systems   Review of Systems   Constitutional: Negative for chills, fever and weight loss.   HENT: Positive for congestion. Negative for sinus pain.    Respiratory: Positive for cough. Negative for hemoptysis, sputum production and shortness of breath.    Gastrointestinal: Negative for abdominal pain, constipation, diarrhea, nausea and vomiting.   Musculoskeletal: Positive for back pain and joint pain. Negative for falls.    Neurological: Negative for dizziness, tingling, speech change, focal weakness and headaches.        Vitals/Objective:     Vitals:    01/05/17 0937 01/05/17 0940   BP: (!) 151/102 (!) 135/91   Pulse: 92 93   Resp: 18    Temp: 98.2 ??F (36.8 ??C)    TempSrc: Oral    SpO2: 99%    Weight: 161 lb (73 kg)    Height: 5\' 6"  (1.676 m)      Body mass index is 25.99 kg/(m^2).    Wt Readings from Last 3 Encounters:   01/05/17 161 lb (73 kg)   12/29/16 169 lb (76.7 kg)   08/14/16 180 lb (81.6 kg)       Physical Exam   Constitutional: She appears well-developed and well-nourished. No distress.   HENT:   Head: Normocephalic and atraumatic.   Cardiovascular: Normal rate and regular rhythm.    Pulmonary/Chest: Effort normal and breath sounds normal.   Skin: Skin is warm and dry.    Posture: normal. SItting on right hip only            Deformity: None             ROM:  Flexion: Normal                                  Extension: Normal                                   Lateral bending: Normal   ??            Gait: ataxic   ??            Palpation:                                  L1-L5:+ tenderness                                  Sacrum: No tenderness                                  Coccyx: No tenderness                                  Left Paraspinal:+ tenderness                                  Right Paraspinal:+ tenderness  ??            Strength (0-5/5)             Hip Flexion:  Left: 3/5                                         Right: 5/5             Hip Extension:Left: 3/5                                         Right: 5/5             Hip Abduction:Left: 4/5                                         Right: 5/5             Hip Adduction:Left: 3/5                                         Right: 5/5             Knee Extension:Left: 3/5                                     Right: 4/5             Knee Flexion:Left: 3/5  Right: 5/5             Ankle dorsiflexion: Left: 5/5                                  Right: 5/5            Ankle plantarflexion:Left: 5/5                                Right: 5/5            Great toe extension:Left: 5/5                                Right: 5/5  ??            Sensation: Intact, no deficits   ??            DTR:            Patella:                      Left: +2                                       Right: +2            Achilles:                      Left: +2                                       Right: +2  ??            Special test:            Straight leg:               Left: Positive                        Right: Negative            Pearlean Brownie???s:                   Left: Negative                         Right: Negative            Piriformis:                  Left: Negative                       Right: Negative  ??    No results found for this or any previous visit (from the past 24 hour(s)).  Assessment and Plan:     Encounter Diagnoses     ICD-10-CM ICD-9-CM   1. Chronic bilateral low back pain with left-sided sciatica M54.42 724.2    G89.29 724.3     338.29   2. Chronic pain of left knee M25.562 719.46    G89.29 338.29       1. Chronic bilateral low back pain with left-sided sciatica  2. Chronic pain of left knee  Given patient's improve mobility will continue medication. Advised her if on future drug screens are positive unprescribed medication, I would have to terminate filling medication for her.   - HYDROcodone-acetaminophen (NORCO) 7.5-325 mg per tablet; Take 1 Tab by mouth every six (6) hours as needed for Pain. Max Daily Amount: 4 Tabs.  Dispense: 56 Tab; Refill: 0    I have discussed the diagnosis with the patient and the intended plan as seen in the above orders.  she has expressed understanding.  The patient has received an after-visit summary and questions were answered concerning future plans.  I have discussed medication side effects and warnings with the patient as well.     Electronically Signed: Bonne DoloresKimberly A Hoke Baer, MD     History/Allergies   Patients past medical, surgical and family histories were reviewed and updated.    Past Medical History:   Diagnosis Date   ??? Anxiety    ??? 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       Past Surgical History:   Procedure Laterality Date   ??? HX TUBAL LIGATION Bilateral 2003     Family History   Problem Relation Age of Onset   ??? Other Mother      fibroid tumor   ??? No Known Problems Father      Social History     Social History   ??? Marital status: DIVORCED     Spouse name: N/A   ??? Number of children: N/A   ??? Years of education: N/A     Occupational History   ??? Not on file.     Social History Main Topics   ??? Smoking status: Smoker, Current Status Unknown     Packs/day: 1.00   ??? Smokeless tobacco: Never Used   ??? Alcohol use No   ??? Drug use: No   ??? Sexual activity: Yes     Partners: Male     Other Topics Concern   ??? Not on file     Social History Narrative    Breast Cancer Risk Factors update 08/14/16    # of Children:  G3 P2013    Age at Delivery of First Child: 24.      Hysterectomy/oophorectomy:  No/No.      Breast Bx: No.      Hx of Breast Feeding:  no.      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     Hormone therapy: No.     FamHx of breast ca in first degree relative: No         Allergies   Allergen Reactions   ??? Codeine Rash and Nausea Only       Disposition     Follow-up Disposition:  Return in about 4 weeks (around 02/02/2017) for Routine.    Future Appointments  Date Time Provider Department Center   02/02/2017 9:30 AM Bonne DoloresKimberly A Darold Miley, MD [facility] SCHED   02/17/2017 9:10 AM Bonne DoloresKimberly A Kannen Moxey, MD [facility] SCHED            Current Medications after this visit     Current Outpatient Prescriptions   Medication Sig   ??? [START ON 01/19/2017] HYDROcodone-acetaminophen (NORCO) 7.5-325 mg per tablet Take 1 Tab by mouth every six (6) hours as needed for Pain. Max Daily Amount: 4 Tabs.    ??? ibuprofen (MOTRIN) 800 mg tablet Take 800 mg by mouth every four (  4) hours.   ??? phenylephrine hcl (SUPHEDRINE PE) 10 mg tab Take  by mouth every four (4) hours.     No current facility-administered medications for this visit.      Medications Discontinued During This Encounter   Medication Reason   ??? HYDROcodone-acetaminophen (NORCO) 7.5-325 mg per tablet Reorder   ??? HYDROcodone-acetaminophen (NORCO) 7.5-325 mg per tablet Reorder

## 2017-01-05 NOTE — Progress Notes (Signed)
1. Have you been to the ER, urgent care clinic, or been hospitalized since your last visit?  No     2. Have you seen or consulted any other health care providers outside of the [facility] since your last visit?  No     Reviewed record in preparation for visit and have necessary documentation  opportunity was given for questions  Goals that were addressed and/or need to be completed during or after this appointment include     Health Maintenance Due   Topic Date Due   ??? Pneumococcal 19-64 Medium Risk (1 of 1 - PPSV23) 07/23/1996   ??? DTaP/Tdap/Td series (1 - Tdap) 07/23/1998

## 2017-02-02 ENCOUNTER — Ambulatory Visit: Admit: 2017-02-02 | Discharge: 2017-02-02 | Payer: PRIVATE HEALTH INSURANCE | Attending: Family Medicine

## 2017-02-02 DIAGNOSIS — G8929 Other chronic pain: Secondary | ICD-10-CM

## 2017-02-02 MED ORDER — HYDROXYZINE PAMOATE 25 MG CAP
25 mg | ORAL_CAPSULE | Freq: Three times a day (TID) | ORAL | 1 refills | Status: DC | PRN
Start: 2017-02-02 — End: 2017-05-31

## 2017-02-02 MED ORDER — HYDROCHLOROTHIAZIDE 25 MG TAB
25 mg | ORAL_TABLET | Freq: Every day | ORAL | 1 refills | Status: DC
Start: 2017-02-02 — End: 2017-04-06

## 2017-02-02 MED ORDER — HYDROCODONE-ACETAMINOPHEN 7.5 MG-325 MG TAB
ORAL_TABLET | Freq: Four times a day (QID) | ORAL | 0 refills | Status: DC | PRN
Start: 2017-02-02 — End: 2017-03-02

## 2017-02-02 NOTE — Patient Instructions (Addendum)
DASH Diet: Care Instructions  Your Care Instructions    The DASH diet is an eating plan that can help lower your blood pressure. DASH stands for Dietary Approaches to Stop Hypertension. Hypertension is high blood pressure.  The DASH diet focuses on eating foods that are high in calcium, potassium, and magnesium. These nutrients can lower blood pressure. The foods that are highest in these nutrients are fruits, vegetables, low-fat dairy products, nuts, seeds, and legumes. But taking calcium, potassium, and magnesium supplements instead of eating foods that are high in those nutrients does not have the same effect. The DASH diet also includes whole grains, fish, and poultry.  The DASH diet is one of several lifestyle changes your doctor may recommend to lower your high blood pressure. Your doctor may also want you to decrease the amount of sodium in your diet. Lowering sodium while following the DASH diet can lower blood pressure even further than just the DASH diet alone.  Follow-up care is a key part of your treatment and safety. Be sure to make and go to all appointments, and call your doctor if you are having problems. It's also a good idea to know your test results and keep a list of the medicines you take.  How can you care for yourself at home?  Following the DASH diet  ?? Eat 4 to 5 servings of fruit each day. A serving is 1 medium-sized piece of fruit, ?? cup chopped or canned fruit, 1/4 cup dried fruit, or 4 ounces (?? cup) of fruit juice. Choose fruit more often than fruit juice.  ?? Eat 4 to 5 servings of vegetables each day. A serving is 1 cup of lettuce or raw leafy vegetables, ?? cup of chopped or cooked vegetables, or 4 ounces (?? cup) of vegetable juice. Choose vegetables more often than vegetable juice.  ?? Get 2 to 3 servings of low-fat and fat-free dairy each day. A serving is 8 ounces of milk, 1 cup of yogurt, or 1 ?? ounces of cheese.   ?? Eat 6 to 8 servings of grains each day. A serving is 1 slice of bread, 1 ounce of dry cereal, or ?? cup of cooked rice, pasta, or cooked cereal. Try to choose whole-grain products as much as possible.  ?? Limit lean meat, poultry, and fish to 2 servings each day. A serving is 3 ounces, about the size of a deck of cards.  ?? Eat 4 to 5 servings of nuts, seeds, and legumes (cooked dried beans, lentils, and split peas) each week. A serving is 1/3 cup of nuts, 2 tablespoons of seeds, or ?? cup of cooked beans or peas.  ?? Limit fats and oils to 2 to 3 servings each day. A serving is 1 teaspoon of vegetable oil or 2 tablespoons of salad dressing.  ?? Limit sweets and added sugars to 5 servings or less a week. A serving is 1 tablespoon jelly or jam, ?? cup sorbet, or 1 cup of lemonade.  ?? Eat less than 2,300 milligrams (mg) of sodium a day. If you limit your sodium to 1,500 mg a day, you can lower your blood pressure even more.  Tips for success  ?? Start small. Do not try to make dramatic changes to your diet all at once. You might feel that you are missing out on your favorite foods and then be more likely to not follow the plan. Make small changes, and stick with them. Once those changes become habit, add a   few more changes.  ?? Try some of the following:  ?? Make it a goal to eat a fruit or vegetable at every meal and at snacks. This will make it easy to get the recommended amount of fruits and vegetables each day.  ?? Try yogurt topped with fruit and nuts for a snack or healthy dessert.  ?? Add lettuce, tomato, cucumber, and onion to sandwiches.  ?? Combine a ready-made pizza crust with low-fat mozzarella cheese and lots of vegetable toppings. Try using tomatoes, squash, spinach, broccoli, carrots, cauliflower, and onions.  ?? Have a variety of cut-up vegetables with a low-fat dip as an appetizer instead of chips and dip.  ?? Sprinkle sunflower seeds or chopped almonds over salads. Or try adding  chopped walnuts or almonds to cooked vegetables.  ?? Try some vegetarian meals using beans and peas. Add garbanzo or kidney beans to salads. Make burritos and tacos with mashed pinto beans or black beans.  Where can you learn more?  Go to http://www.healthwise.net/[employer].  Enter H967 in the search box to learn more about "DASH Diet: Care Instructions."  Current as of: May 20, 2016  Content Version: 11.7  ?? 2006-2018 [employer]. Care instructions adapted under license by [employer] (which disclaims liability or warranty for this information). If you have questions about a medical condition or this instruction, always ask your healthcare professional. [employer] disclaims any warranty or liability for your use of this information.

## 2017-02-02 NOTE — Progress Notes (Signed)
Brandi Clark  41 y.o. female  01-29-1976  [address]  [place] [address]  [id number]     [facility]: Progress Note       Encounter Date: 02/02/2017    Chief Complaint   Patient presents with   ??? Medication Refill       History provided by patient  History of Present Illness   Brandi Clark is a 41 y.o. female who presents to clinic today for:    Back Pain and left knee pain  History of pain  12/26/2016: "Patient present with cc of back pain. Has a history of sciatica which she has been using IBU 800 mg for. ??Had been prescribed gabapentin on 07/28/2016 without any decrease in pain??as well as PT which helped in the short term. Has also tried flexeril without any response.   - She has seen Dr. 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   - She is upset and anxious. Reports that her boyfriend has been cheating on her and she is "stuck with him." She does not have the resources to move at present except back to [facility] but she is not licensed to work there at present.        Review of Systems   Review of Systems   Constitutional: Negative for chills and fever.   Eyes: Negative for double vision and photophobia.   Gastrointestinal: Negative for abdominal pain, constipation, diarrhea, nausea and vomiting.   Musculoskeletal: Positive for back pain and joint pain. Negative for falls, myalgias and neck pain.   Skin: Negative for itching and rash.   Neurological: Negative for dizziness and headaches.    Psychiatric/Behavioral: Negative for depression, hallucinations and substance abuse. The patient is nervous/anxious and has insomnia.         Vitals/Objective:     Vitals:    02/02/17 0942 02/02/17 0947   BP: (!) 160/103 (!) 152/101   Pulse: 81 80   Resp: 18    Temp: 98.2 ??F (36.8 ??C)    TempSrc: Oral    SpO2: 97%    Weight: 174 lb (78.9 kg)    Height: 5\' 6"  (1.676 m)      Body mass index is 28.08 kg/(m^2).    Wt Readings from Last 3 Encounters:   02/02/17 174 lb (78.9 kg)   01/05/17 161 lb (73 kg)   12/29/16 169 lb (76.7 kg)       Physical Exam   Constitutional: She is oriented to person, place, and time. She appears well-developed and well-nourished.   HENT:   Head: Normocephalic and atraumatic.   Eyes: Conjunctivae are normal. Right eye exhibits no discharge. Left eye exhibits no discharge.   Cardiovascular: Normal rate and regular rhythm.    No murmur heard.  Pulmonary/Chest: Effort normal and breath sounds normal. No respiratory distress. She has no wheezes.   Neurological: She is alert and oriented to person, place, and time.  Skin: Skin is warm and dry.        No results found for this or any previous visit (from the past 24 hour(s)).  Assessment and Plan:     Encounter Diagnoses     ICD-10-CM ICD-9-CM   1. Chronic bilateral low back pain with left-sided sciatica M54.42 724.2    G89.29 724.3     338.29   2. Chronic pain of left knee M25.562 719.46    G89.29 338.29   3. Anxiety F41.9 300.00   4. Essential hypertension I10 401.9       1. Chronic bilateral low back pain with left-sided sciatica  2. Chronic pain of left knee  Refill pain medication. UDS with next labs.   - HYDROcodone-acetaminophen (NORCO) 7.5-325 mg per tablet; Take 1 Tab by mouth every six (6) hours as needed for Pain. Max Daily Amount: 4 Tabs.  Dispense: 112 Tab; Refill: 0  - TOXASSURE SELECT 13 (MW)    3. Anxiety  Patient has yet to find name of doctor nor list od anti-anxiety medications she had taken in the past.    - hydrOXYzine pamoate (VISTARIL) 25 mg capsule; Take 1 Cap by mouth three (3) times daily as needed for Anxiety. Indications: anxiety  Dispense: 90 Cap; Refill: 1  - TOXASSURE SELECT 13 (MW)    4. Essential hypertension  Start HCTZ for BP. Lab check in 2 weeks.   - hydroCHLOROthiazide (HYDRODIURIL) 25 mg tablet; Take 1 Tab by mouth daily. Indications: hypertension  Dispense: 30 Tab; Refill: 1  - METABOLIC PANEL, BASIC    I have discussed the diagnosis with the patient and the intended plan as seen in the above orders.  she has expressed understanding.  The patient has received an after-visit summary and questions were answered concerning future plans.  I have discussed medication side effects and warnings with the patient as well.    Electronically Signed: Bonne Dolores, MD     History/Allergies   Patients past medical, surgical and family histories were reviewed and updated.    Past Medical History:   Diagnosis Date   ??? Anxiety    ??? 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       Past Surgical History:   Procedure Laterality Date   ??? HX TUBAL LIGATION Bilateral 2003     Family History   Problem Relation Age of Onset   ??? Other Mother      fibroid tumor   ??? No Known Problems Father      Social History     Social History   ??? Marital status: DIVORCED     Spouse name: N/A   ??? Number of children: N/A   ??? Years of education: N/A     Occupational History   ??? Not on file.     Social History Main Topics   ??? Smoking status: Smoker, Current Status Unknown     Packs/day: 1.00   ??? Smokeless tobacco: Never Used   ??? Alcohol use No   ??? Drug use: No   ??? Sexual activity: Yes     Partners: Male     Other Topics Concern   ??? Not on file     Social History Narrative    Breast Cancer Risk Factors update 08/14/16    # of Children:  G3 P2013    Age at Delivery of First Child: 24.      Hysterectomy/oophorectomy:  No/No.  Breast Bx: No.      Hx of Breast Feeding:  no.      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      Hormone therapy: No.     FamHx of breast ca in first degree relative: No         Allergies   Allergen Reactions   ??? Codeine Rash and Nausea Only       Disposition     Follow-up Disposition:  Return in about 2 weeks (around 02/16/2017) for LAB appointment ONLY. Then routine in 4 weeks. .    Future Appointments  Date Time Provider Department Center   03/02/2017 9:30 AM Bonne Dolores, MD [facility] SCHED            Current Medications after this visit     Current Outpatient Prescriptions   Medication Sig   ??? HYDROcodone-acetaminophen (NORCO) 7.5-325 mg per tablet Take 1 Tab by mouth every six (6) hours as needed for Pain. Max Daily Amount: 4 Tabs.   ??? hydrOXYzine pamoate (VISTARIL) 25 mg capsule Take 1 Cap by mouth three (3) times daily as needed for Anxiety. Indications: anxiety   ??? hydroCHLOROthiazide (HYDRODIURIL) 25 mg tablet Take 1 Tab by mouth daily. Indications: hypertension   ??? ibuprofen (MOTRIN) 800 mg tablet Take 800 mg by mouth every four (4) hours.   ??? phenylephrine hcl (SUPHEDRINE PE) 10 mg tab Take  by mouth every four (4) hours.     No current facility-administered medications for this visit.      Medications Discontinued During This Encounter   Medication Reason   ??? HYDROcodone-acetaminophen (NORCO) 7.5-325 mg per tablet Reorder

## 2017-02-02 NOTE — Progress Notes (Signed)
1. Have you been to the ER, urgent care clinic, or been hospitalized since your last visit?  No     2. Have you seen or consulted any other health care providers outside of the [facility] since your last visit? No     Reviewed record in preparation for visit and have necessary documentation  opportunity was given for questions  Goals that were addressed and/or need to be completed during or after this appointment include     Health Maintenance Due   Topic Date Due   ??? Pneumococcal 19-64 Medium Risk (1 of 1 - PPSV23) 07/23/1996   ??? DTaP/Tdap/Td series (1 - Tdap) 07/23/1998   ??? Influenza Age 39 to Adult  01/13/2017

## 2017-02-16 ENCOUNTER — Encounter: Attending: Family Medicine

## 2017-02-17 ENCOUNTER — Encounter: Attending: Family Medicine

## 2017-02-19 LAB — METABOLIC PANEL, BASIC
BUN/Creatinine ratio: 18 (ref 9–23)
BUN: 20 mg/dL (ref 6–20)
CO2: 22 mmol/L (ref 20–29)
Calcium: 9.7 mg/dL (ref 8.7–10.2)
Chloride: 98 mmol/L (ref 96–106)
Creatinine: 1.14 mg/dL — ABNORMAL HIGH (ref 0.57–1.00)
GFR est AA: 70 mL/min/{1.73_m2} (ref >59)
GFR est non-AA: 61 mL/min/{1.73_m2} (ref >59)
Glucose: 88 mg/dL (ref 65–99)
Potassium: 3.1 mmol/L — ABNORMAL LOW (ref 3.5–5.2)
Sodium: 141 mmol/L (ref 134–144)

## 2017-02-19 LAB — TOXASSURE SELECT 13 (MW)

## 2017-03-02 ENCOUNTER — Ambulatory Visit: Admit: 2017-03-02 | Discharge: 2017-03-02 | Payer: PRIVATE HEALTH INSURANCE | Attending: Family Medicine

## 2017-03-02 DIAGNOSIS — L301 Dyshidrosis [pompholyx]: Secondary | ICD-10-CM

## 2017-03-02 MED ORDER — CLOBETASOL 0.05 % OINTMENT
0.05 % | Freq: Two times a day (BID) | CUTANEOUS | 0 refills | Status: DC
Start: 2017-03-02 — End: 2018-09-12

## 2017-03-02 NOTE — Progress Notes (Addendum)
Brandi Clark  41 y.o. female  [birth year]  [address]  [place] [address]  [id number]     [facility]: Progress Note       Encounter Date: 03/02/2017    Chief Complaint   Patient presents with   ??? Skin Problem     blisters to Left index finger   ??? Medication Refill   ??? Immunization/Injection     flu vaccine       History provided by patient  History of Present Illness   Brandi Clark is a 41 y.o. female who presents to clinic today for:    Blisters on left pointer finger  Patient present with cc of blisters on left index finger > months. Associated with intense pruritis. Patient has been applying different moisturizer and calamine with no improvement.     Back Pain and left knee pain  History of pain  12/26/2016: "Patient present with cc of back pain. Has a history of sciatica which she has been using IBU 800 mg for. ??Had been prescribed gabapentin on 07/28/2016 without any decrease in pain??as well as PT which helped in the short term. Has also tried flexeril without any response.   - She has seen Dr. 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 Patient admitted to there usage when I brought up that we needed to have a discussion of her results.  ??  Review of Systems   Review of Systems   Constitutional: Negative for chills and fever.   Musculoskeletal: Positive for back pain, joint pain and myalgias. Negative for falls and neck pain.   Skin: Negative for itching and rash.   Neurological: Negative for dizziness and headaches.   Psychiatric/Behavioral: Positive for depression. Negative for substance abuse and suicidal ideas. The patient is nervous/anxious.         Vitals/Objective:     Vitals:    03/02/17 0936   BP: 126/77   Pulse: 91    Resp: 18   Temp: 98.1 ??F (36.7 ??C)   TempSrc: Oral   SpO2: 98%   Weight: 165 lb (74.8 kg)   Height:  (1.676 m)     Body mass index is 26.63 kg/(m^2).    Wt Readings from Last 3 Encounters:   03/02/17 165 lb (74.8 kg)   02/02/17 174 lb (78.9 kg)   01/05/17 161 lb (73 kg)       Physical Exam   Constitutional: She is oriented to person, place, and time. She appears well-developed and well-nourished.   HENT:   Head: Normocephalic and atraumatic.   Eyes: Conjunctivae are normal. Right eye exhibits no discharge. Left eye exhibits no discharge.   Cardiovascular: Normal rate and regular rhythm.    No murmur heard.  Pulmonary/Chest: Effort normal and breath sounds normal. No respiratory distress. She has no wheezes.   Neurological: She is alert and oriented to person, place, and time.   Skin: Skin is warm and dry. Rash (tense vesicles and scaling of skin on lateral side of left index finger. ) noted.        No results found for this or any previous visit (from the past 24 hour(s)).  Assessment and Plan:  Encounter Diagnoses     ICD-10-CM ICD-9-CM   1. 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 Encounter for immunization Z23 V03.89   3. Chronic bilateral low back pain with left-sided sciatica M54.42 724.2    G89.29 724.3     338.29   4. Chronic pain of left knee M25.562 719.46    G89.29 338.29       1. Dyshidrotic eczema  Advised tips on good moisturizing   - clobetasol (TEMOVATE) 0.05 % ointment; Apply  to affected area two (2) times a day. To eczema on hands.  Dispense: 15 g; Refill: 0    2. Encounter for immunization  - INFLUENZA VIRUS VACCINE QUADRIVALENT, PRESERVATIVE FREE SYRINGE ([address])  - PR IMMUNIZ ADMIN,1 SINGLE/COMB VAC/TOXOID    3. Chronic bilateral low back pain with left-sided sciatica  4. Chronic pain of left knee  Due to UDS containing Alprazolam and THC, I will no longer be able to write prescriptions for the patient. She was understanding but declined  any further referral to pain or ortho as well as psych or counseling for anxiety and depression.       I have discussed the diagnosis with the patient and the intended plan as seen in the above orders.  she has expressed understanding.  The patient has received an after-visit summary and questions were answered concerning future plans.  I have discussed medication side effects and warnings with the patient as well.    Electronically Signed: Bonne Dolores, MD     History/Allergies   Patients past medical, surgical and family histories were reviewed and updated.    Past Medical History:   Diagnosis Date   ??? Anxiety    ??? 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       Past Surgical History:   Procedure Laterality Date   ??? HX TUBAL LIGATION Bilateral 2003     Family History   Problem Relation Age of Onset   ??? Other Mother      fibroid tumor   ??? No Known Problems Father      Social History     Social History   ??? Marital status: DIVORCED     Spouse name: N/A   ??? Number of children: N/A   ??? Years of education: N/A     Occupational History   ??? Not on file.     Social History Main Topics   ??? Smoking status: Smoker, Current Status Unknown     Packs/day: 1.00   ??? Smokeless tobacco: Never Used   ??? Alcohol use No   ??? Drug use: No   ??? Sexual activity: Yes     Partners: Male     Other Topics Concern   ??? Not on file     Social History Narrative    Breast Cancer Risk Factors update 08/14/16    # of Children:  G3 P2013    Age at Delivery of First Child: 24.      Hysterectomy/oophorectomy:  No/No.      Breast Bx: No.      Hx of Breast Feeding:  no.      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     Hormone therapy: No.     FamHx of breast ca in first degree relative: No         Allergies   Allergen Reactions   ??? Codeine Rash and Nausea Only  Disposition     Follow-up Disposition:  Return in about 3 months (around 06/01/2017) for Routine with blood work.    Future Appointments  Date Time Provider Department Center    06/01/2017 9:10 AM Bonne DoloresKimberly A Britani Beattie, MD [facility] SCHED            Current Medications after this visit     Current Outpatient Prescriptions   Medication Sig   ??? clobetasol (TEMOVATE) 0.05 % ointment Apply  to affected area two (2) times a day. To eczema on hands.   ??? hydrOXYzine pamoate (VISTARIL) 25 mg capsule Take 1 Cap by mouth three (3) times daily as needed for Anxiety. Indications: anxiety   ??? hydroCHLOROthiazide (HYDRODIURIL) 25 mg tablet Take 1 Tab by mouth daily. Indications: hypertension   ??? ibuprofen (MOTRIN) 800 mg tablet Take 800 mg by mouth every four (4) hours.   ??? phenylephrine hcl (SUPHEDRINE PE) 10 mg tab Take  by mouth every four (4) hours.     No current facility-administered medications for this visit.      Medications Discontinued During This Encounter   Medication Reason   ??? HYDROcodone-acetaminophen (NORCO) 7.5-325 mg per tablet Therapy Completed

## 2017-03-02 NOTE — Progress Notes (Signed)
1. Have you been to the ER, urgent care clinic, or been hospitalized since your last visit?  No     2. Have you seen or consulted any other health care providers outside of the [facility] since your last visit? No     Reviewed record in preparation for visit and have necessary documentation  opportunity was given for questions  Goals that were addressed and/or need to be completed during or after this appointment include     Health Maintenance Due   Topic Date Due   ??? Pneumococcal 19-64 Medium Risk (1 of 1 - PPSV23) 07/23/1996   ??? DTaP/Tdap/Td series (1 - Tdap) 07/23/1998   ??? Influenza Age 78 to Adult  01/13/2017

## 2017-03-02 NOTE — Patient Instructions (Addendum)
Influenza (Flu) Vaccine (Inactivated or Recombinant): What You Need to Know  Why get vaccinated?  Influenza ("flu") is a contagious disease that spreads around the [place] every winter, usually between October and May.  Flu is caused by influenza viruses and is spread mainly by coughing, sneezing, and close contact.  Anyone can get flu. Flu strikes suddenly and can last several days. Symptoms vary by age, but can include:  ?? Fever/chills.  ?? Sore throat.  ?? Muscle aches.  ?? Fatigue.  ?? Cough.  ?? Headache.  ?? Runny or stuffy nose.  Flu can also lead to pneumonia and blood infections, and cause diarrhea and seizures in children. If you have a medical condition, such as heart or lung disease, flu can make it worse.  Flu is more dangerous for some people. Infants and young children, people 65 years of age and older, pregnant women, and people with certain health conditions or a weakened immune system are at greatest risk.  Each year thousands of people in the [place] die from flu, and many more are hospitalized.  Flu vaccine can:  ?? Keep you from getting flu.  ?? Make flu less severe if you do get it.  ?? Keep you from spreading flu to your family and other people.  Inactivated and recombinant flu vaccines  A dose of flu vaccine is recommended every flu season. Children 6 months through 8 years of age may need two doses during the same flu season. Everyone else needs only one dose each flu season.  Some inactivated flu vaccines contain a very small amount of a mercury-based preservative called thimerosal. Studies have not shown thimerosal in vaccines to be harmful, but flu vaccines that do not contain thimerosal are available.  There is no live flu virus in flu shots. They cannot cause the flu.  There are many flu viruses, and they are always changing. Each year a new flu vaccine is made to protect against three or four viruses that are likely to cause disease in the upcoming flu season. But even when the  vaccine doesn't exactly match these viruses, it may still provide some protection.  Flu vaccine cannot prevent:  ?? Flu that is caused by a virus not covered by the vaccine.  ?? Illnesses that look like flu but are not.  Some people should not get this vaccine  Tell the person who is giving you the vaccine:  ?? If you have any severe (life-threatening) allergies. If you ever had a life-threatening allergic reaction after a dose of flu vaccine, or have a severe allergy to any part of this vaccine, you may be advised not to get vaccinated. Most, but not all, types of flu vaccine contain a small amount of egg protein.  ?? If you ever had Guillain-Barr?? syndrome (also called GBS) Some people with a history of GBS should not get this vaccine. This should be discussed with your doctor.  ?? If you are not feeling well. It is usually okay to get flu vaccine when you have a mild illness, but you might be asked to come back when you feel better.  Risks of a vaccine reaction  With any medicine, including vaccines, there is a chance of reactions. These are usually mild and go away on their own, but serious reactions are also possible.  Most people who get a flu shot do not have any problems with it.  Minor problems following a flu shot include:  ?? Soreness, redness, or swelling   where the shot was given  ?? Hoarseness  ?? Sore, red or itchy eyes  ?? Cough  ?? Fever  ?? Aches  ?? Headache  ?? Itching  ?? Fatigue  If these problems occur, they usually begin soon after the shot and last 1 or 2 days.  More serious problems following a flu shot can include the following:  ?? There may be a small increased risk of Guillain-Barr?? Syndrome (GBS) after inactivated flu vaccine. This risk has been estimated at 1 or 2 additional cases per million people vaccinated. This is much lower than the risk of severe complications from flu, which can be prevented by flu vaccine.  ?? Young children who get the flu shot along with pneumococcal vaccine  (PCV13) and/or DTaP vaccine at the same time might be slightly more likely to have a seizure caused by fever. Ask your doctor for more information. Tell your doctor if a child who is getting flu vaccine has ever had a seizure  Problems that could happen after any injected vaccine:  ?? People sometimes faint after a medical procedure, including vaccination. Sitting or lying down for about 15 minutes can help prevent fainting, and injuries caused by a fall. Tell your doctor if you feel dizzy, or have vision changes or ringing in the ears.  ?? Some people get severe pain in the shoulder and have difficulty moving the arm where a shot was given. This happens very rarely.  ?? Any medication can cause a severe allergic reaction. Such reactions from a vaccine are very rare, estimated at about 1 in a million doses, and would happen within a few minutes to a few hours after the vaccination.  As with any medicine, there is a very remote chance of a vaccine causing a serious injury or death.  The safety of vaccines is always being monitored. For more information, visit: www.cdc.gov/vaccinesafety/.  What if there is a serious reaction?  What should I look for?  ?? Look for anything that concerns you, such as signs of a severe allergic reaction, very high fever, or unusual behavior.  Signs of a severe allergic reaction can include hives, swelling of the face and throat, difficulty breathing, a fast heartbeat, dizziness, and weakness - usually within a few minutes to a few hours after the vaccination.  What should I do?  ?? If you think it is a severe allergic reaction or other emergency that can't wait, call 9-1-1 and get the person to the nearest hospital. Otherwise, call your doctor.  ?? Reactions should be reported to the "Vaccine Adverse Event Reporting System" (VAERS). Your doctor should file this report, or you can do it yourself through the VAERS website at www.vaers.hhs.gov, or by calling 1-800-822-7967.   VAERS does not give medical advice.  The National Vaccine Injury Compensation Program  The National Vaccine Injury Compensation Program (VICP) is a federal program that was created to compensate people who may have been injured by certain vaccines.  Persons who believe they may have been injured by a vaccine can learn about the program and about filing a claim by calling 1-800-338-2382 or visiting the VICP website at www.hrsa.gov/vaccinecompensation. There is a time limit to file a claim for compensation.  How can I learn more?  ?? Ask your healthcare provider. He or she can give you the vaccine package insert or suggest other sources of information.  ?? Call your local or state health department.  ?? Contact the Centers for Disease Control and Prevention (CDC):  ??   Call (314) 184-00841-(937)828-3914 (1-800-CDC-INFO) or  ?? Visit CDC's website at BiotechRoom.com.cywww.cdc.gov/flu  Vaccine Information Statement  Inactivated Influenza Vaccine  01/19/2014)  42 [place] ?? [phone]  Department of Health and Insurance risk surveyorHuman Services  Centers for Disease Control and Prevention  Many Vaccine Information Statements are available in Spanish and other languages. See PromoAge.com.brwww.immunize.org/vis.  Muchas hojas de informaci??n sobre vacunas est??n disponibles en espa??ol y en otros idiomas. Visite PromoAge.com.brwww.immunize.org/vis.  Care instructions adapted under license by [employer] (which disclaims liability or warranty for this information). If you have questions about a medical condition or this instruction, always ask your healthcare professional. [employer] disclaims any warranty or liability for your use of this information.       Dermatitis: Care Instructions  Your Care Instructions  Dermatitis is the general name used for any rash or inflammation of the skin. Different kinds of dermatitis cause different kinds of rashes. Common causes of a rash include new medicines, plants (such as poison oak or poison ivy), heat, and stress. Certain illnesses can also cause a rash.   An allergic reaction to something that touches your skin, such as latex, nickel, or poison ivy, is called contact dermatitis. Contact dermatitis may also be caused by something that irritates the skin, such as bleach, a chemical, or soap. These types of rashes cannot be spread from person to person.  How long your rash will last depends on what caused it. Rashes may last a few days or months.  Follow-up care is a key part of your treatment and safety. Be sure to make and go to all appointments, and call your doctor if you are having problems. It's also a good idea to know your test results and keep a list of the medicines you take.  How can you care for yourself at home?  ?? Do not scratch the rash. Cut your nails short, and file them smooth. Or wear gloves if this helps keep you from scratching.  ?? Wash the area with water only. Pat dry.  ?? Put cold, wet cloths on the rash to reduce itching.  ?? Keep cool, and stay out of the sun.  ?? Leave the rash open to the air as much as possible.  ?? If the rash itches, use hydrocortisone cream. Follow the directions on the label. Calamine lotion may help for plant rashes.  ?? Take an over-the-counter antihistamine, such as diphenhydramine (Benadryl) or loratadine (Claritin), to help calm the itching. Read and follow all instructions on the label.  ?? If your doctor prescribed a cream, use it as directed. If your doctor prescribed medicine, take it exactly as directed.  When should you call for help?  Call your doctor now or seek immediate medical care if:  ?? ?? You have symptoms of infection, such as:  ?? Increased pain, swelling, warmth, or redness.  ?? Red streaks leading from the area.  ?? Pus draining from the area.  ?? A fever.   ?? ?? You have joint pain along with the rash.   ??Watch closely for changes in your health, and be sure to contact your doctor if:  ?? ?? Your rash is changing or getting worse.   ?? ?? You are not getting better as expected.   Where can you learn more?   Go to InsuranceStats.cahttp://www.healthwise.net/[employer]Help[employer].  Enter F270 in the search box to learn more about "Dermatitis: Care Instructions."  Current as of: March 19, 2016  Content Version: 11.7  ?? 2006-2018 Healthwise[employer] Inc[employer]. Care instructions  adapted under license by [employer] (which disclaims liability or warranty for this information). If you have questions about a medical condition or this instruction, always ask your healthcare professional. Healthwise[employer] Incorporated disclaims any warranty or liability for your use of this information.

## 2017-03-24 ENCOUNTER — Encounter: Admit: 2017-03-24 | Discharge: 2017-03-24 | Payer: PRIVATE HEALTH INSURANCE

## 2017-04-06 ENCOUNTER — Encounter

## 2017-04-07 MED ORDER — HYDROCHLOROTHIAZIDE 25 MG TAB
25 mg | ORAL_TABLET | ORAL | 1 refills | Status: DC
Start: 2017-04-07 — End: 2017-06-06

## 2017-05-31 ENCOUNTER — Ambulatory Visit: Admit: 2017-05-31 | Discharge: 2017-05-31 | Payer: PRIVATE HEALTH INSURANCE | Attending: Family Medicine

## 2017-05-31 DIAGNOSIS — I10 Essential (primary) hypertension: Secondary | ICD-10-CM

## 2017-05-31 MED ORDER — NICOTINE 21 MG/24 HR DAILY PATCH
21 mg/24 hr | MEDICATED_PATCH | TRANSDERMAL | 0 refills | Status: AC
Start: 2017-05-31 — End: 2017-06-30

## 2017-05-31 MED ORDER — DIPHTH,PERTUSSIS(ACEL),TETANUS 2.5 LF UNIT-8 MCG-5 LF/0.5ML IM SYRINGE
Freq: Once | INTRAMUSCULAR | 0 refills | Status: AC
Start: 2017-05-31 — End: 2017-05-31

## 2017-05-31 MED ORDER — PNEUMOCOCCAL 23 POLYVALENT VACCINE 25 MCG/0.5 ML INJECTION SYRINGE
25 mcg/0.5 mL | Freq: Once | INTRAMUSCULAR | 0 refills | Status: AC
Start: 2017-05-31 — End: 2017-05-31

## 2017-05-31 MED ORDER — NICOTINE 21 MG/24 HR DAILY PATCH
21 mg/24 hr | MEDICATED_PATCH | TRANSDERMAL | 0 refills | Status: DC
Start: 2017-05-31 — End: 2017-05-31

## 2017-05-31 NOTE — Progress Notes (Signed)
Brandi Clark  41 y.o. female  02-22-1976  [address]  [place] [address]  [id number]     Brandi Clark [facility]: Progress Note       Encounter Date: 05/31/2017    Chief Complaint   Patient presents with   ??? Hypertension       History provided by patient  History of Present Illness   Brandi Clark is a 41 y.o. female who presents to clinic today for:    Hypertension: Stable   BP Readings from Last 3 Encounters:   05/31/17 121/80   03/02/17 126/77   02/02/17 (!) 152/101     The patient reports:  taking medications as instructed, no medication side effects noted, no TIA's, no chest pain on exertion, no dyspnea on exertion, no swelling of ankles.     Home monitoring:No  Sodium   Date Value Ref Range Status   02/16/2017 141 134 - 144 mmol/L Final     Potassium   Date Value Ref Range Status   02/16/2017 3.1 (L) 3.5 - 5.2 mmol/L Final     Creatinine   Date Value Ref Range Status   02/16/2017 1.14 (H) 0.57 - 1.00 mg/dL Final   19/14/7829 5.62 0.57 - 1.00 mg/dL Final       Our goal is to normalize the blood pressure to decrease the risks of strokes and heart attacks. The patient is in agreement with the plan.    Tobacco   The patient was counseled on the dangers of tobacco use, and was advised to quit and referred to a tobacco cessation program.  Reviewed strategies to maximize success, including removing cigarettes and smoking materials from environment, stress management and substitution of other forms of reinforcement.    Health Maintenance  Health Maintenance Due   Topic Date Due   ??? Pneumococcal 19-64 Medium Risk (1 of 1 - PPSV23) 07/23/1996   ??? DTaP/Tdap/Td series (1 - Tdap) 07/23/1998     Review of Systems   Review of Systems   Constitutional: Negative for chills and fever.   Cardiovascular: Negative for chest pain and palpitations.   Gastrointestinal: Negative for abdominal pain, constipation, diarrhea, nausea and vomiting.   Skin: Negative for itching and rash.    Neurological: Negative for dizziness and headaches.       Vitals/Objective:     Vitals:    05/31/17 0946   BP: 121/80   Pulse: (!) 105   Resp: 16   Temp: 97.2 ??F (36.2 ??C)   TempSrc: Oral   SpO2: 100%   Weight: 171 lb (77.6 kg)   Height: 5\' 6"  (1.676 m)     Body mass index is 27.6 kg/m??.    Wt Readings from Last 3 Encounters:   05/31/17 171 lb (77.6 kg)   03/02/17 165 lb (74.8 kg)   02/02/17 174 lb (78.9 kg)       Physical Exam   Constitutional: She is oriented to person, place, and time. She appears well-developed and well-nourished.   HENT:   Head: Normocephalic and atraumatic.   Cardiovascular: Normal rate and regular rhythm.   Pulmonary/Chest: Effort normal and breath sounds normal.   Neurological: She is alert and oriented to person, place, and time.        No results found for this or any previous visit (from the past 24 hour(s)).  Assessment and Plan:     Encounter Diagnoses     ICD-10-CM ICD-9-CM   1. Essential hypertension I10 401.9  2. Encounter for immunization Z23 V03.89   3. Tobacco dependence F17.200 305.1       1. Essential hypertension  Well controlled continue current medications.   - METABOLIC PANEL, BASIC    2. Encounter for immunization  - pneumococcal 23-val ps vaccine (PNEUMOVAX 23) 25 mcg/0.5 mL injection; 0.5 mL by IntraMUSCular route once for 1 dose.  Dispense: 0.5 mL; Refill: 0  - diphth, pertus,acell,, tetanus (BOOSTRIX TDAP) 2.5-8-5 Lf-mcg-Lf/0.495mL syrg; 0.5 mL by IntraMUSCular route once for 1 dose.  Dispense: 0.5 mL; Refill: 0    3. Tobacco dependence  - nicotine (NICODERM CQ) 21 mg/24 hr; 1 Patch by TransDERmal route every twenty-four (24) hours for 30 days.  Dispense: 30 Patch; Refill: 0      I have discussed the diagnosis with the patient and the intended plan as seen in the above orders.  she has expressed understanding.  The patient has received an after-visit summary and questions were answered concerning future plans.  I have discussed medication side effects and warnings with  the patient as well.    Electronically Signed: Bonne DoloresKimberly A Winefred Hillesheim, MD     History/Allergies   Patients past medical, surgical and family histories were reviewed and updated.    Past Medical History:   Diagnosis Date   ??? Anxiety    ??? 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       Past Surgical History:   Procedure Laterality Date   ??? HX TUBAL LIGATION Bilateral 2003     Family History   Problem Relation Age of Onset   ??? Other Mother         fibroid tumor   ??? No Known Problems Father      Social History     Socioeconomic History   ??? Marital status: DIVORCED     Spouse name: Not on file   ??? Number of children: Not on file   ??? Years of education: Not on file   ??? Highest education level: Not on file   Social Needs   ??? Financial resource strain: Not on file   ??? Food insecurity - worry: Not on file   ??? Food insecurity - inability: Not on file   ??? Transportation needs - medical: Not on file   ??? Transportation needs - non-medical: Not on file   Occupational History   ??? Not on file   Tobacco Use   ??? Smoking status: Smoker, Current Status Unknown     Packs/day: 1.00   ??? Smokeless tobacco: Never Used   Substance and Sexual Activity   ??? Alcohol use: No   ??? Drug use: No   ??? Sexual activity: Yes     Partners: Male   Other Topics Concern   ??? Not on file   Social History Narrative    Breast Cancer Risk Factors update 08/14/16    # of Children:  G3 P2013    Age at Delivery of First Child: 24.      Hysterectomy/oophorectomy:  No/No.      Breast Bx: No.      Hx of Breast Feeding:  no.      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     Hormone therapy: No.     FamHx of breast ca in first degree relative: No         Allergies   Allergen Reactions   ??? Codeine Rash and Nausea Only  Other reaction(s): GI intolerance       Disposition     Follow-up Disposition:  Return in about 6 months (around 11/29/2017) for Routine with blood work.    No future appointments.         Current Medications after this visit     Current Outpatient Medications    Medication Sig   ??? pneumococcal 23-val ps vaccine (PNEUMOVAX 23) 25 mcg/0.5 mL injection 0.5 mL by IntraMUSCular route once for 1 dose.   ??? diphth, pertus,acell,, tetanus (BOOSTRIX TDAP) 2.5-8-5 Lf-mcg-Lf/0.525mL syrg 0.5 mL by IntraMUSCular route once for 1 dose.   ??? nicotine (NICODERM CQ) 21 mg/24 hr 1 Patch by TransDERmal route every twenty-four (24) hours for 30 days.   ??? hydroCHLOROthiazide (HYDRODIURIL) 25 mg tablet TAKE 1 TABLET BY MOUTH ONCE DAILY.  INDICATIONS:  HYPERTENSION.   ??? ibuprofen (MOTRIN) 800 mg tablet Take 800 mg by mouth every four (4) hours.   ??? phenylephrine hcl (SUPHEDRINE PE) 10 mg tab Take  by mouth every four (4) hours.   ??? clobetasol (TEMOVATE) 0.05 % ointment Apply  to affected area two (2) times a day. To eczema on hands.     No current facility-administered medications for this visit.      Medications Discontinued During This Encounter   Medication Reason   ??? hydrOXYzine pamoate (VISTARIL) 25 mg capsule Not A Current Medication   ??? nicotine (NICODERM CQ) 21 mg/24 hr Reorder

## 2017-05-31 NOTE — Patient Instructions (Addendum)
[facility]  Affiliated with [facility]  [address] [place]  [address]  [phone]    Monitor blood pressure outside the office several times weekly at different times during the day and evening. Bring the record to me in 3 weeks for review.    Blood Pressure Record     Patient Name:  ______________________ DOB:  ______________________    Date/Time BP Reading Pulse                                                                                                                                                                                                Home Blood Pressure Test: About This Test  What is it?    A home blood pressure test allows you to keep track of your blood pressure at home. Blood pressure is a measure of the force of blood against the walls of your arteries. Blood pressure readings include two numbers, such as 130/80 (say "130 over 80"). The first number is the systolic pressure. The second number is the diastolic pressure.  Why is this test done?  You may do this test at home to:  ?? Find out if you have high blood pressure.  ?? Track your blood pressure if you have high blood pressure.  ?? Track how well medicine is working to reduce high blood pressure.  ?? Check how lifestyle changes, such as weight loss and exercise, are affecting blood pressure.  How can you prepare for the test?  ?? Do not use caffeine, tobacco, or medicines known to raise blood pressure (such as nasal decongestant sprays) for at least 30 minutes before taking your blood pressure.  ?? Do not exercise for at least 30 minutes before taking your blood pressure.  What happens before the test?  Take your blood pressure while you feel comfortable and relaxed. Sit quietly with both feet on the floor for at least 5 minutes before the test.  What happens during the test?  ?? Sit with your arm slightly bent and resting on a table so that your  upper arm is at the same level as your heart.  ?? Roll up your sleeve or take off your shirt to expose your upper arm.  ?? Wrap the blood pressure cuff around your upper arm so that the lower edge of the cuff is about 1 inch above the bend of your elbow.  Proceed with the following steps depending on if you are using an automatic or manual pressure monitor.  Automatic blood pressure monitors  ?? Press the on/off button   on the automatic monitor and wait until the ready-to-measure "heart" symbol appears next to zero in the display window.  ?? Press the start button. The cuff will inflate and deflate by itself.  ?? Your blood pressure numbers will appear on the screen.  ?? Write your numbers in your log book, along with the date and time.  Manual blood pressure monitors  ?? Place the earpieces of a stethoscope in your ears, and place the bell of the stethoscope over the artery, just below the cuff.  ?? Close the valve on the rubber inflating bulb.  ?? Squeeze the bulb rapidly with your opposite hand to inflate the cuff until the dial or column of mercury reads about 30 mm Hg higher than your usual systolic pressure. If you do not know your usual pressure, inflate the cuff to 210 mm Hg or until the pulse at your wrist disappears.  ?? Open the pressure valve just slightly by twisting or pressing the valve on the bulb.  ?? As you watch the pressure slowly fall, note the level on the dial at which you first start to hear a pulsing or tapping sound through the stethoscope. This is your systolic blood pressure.  ?? Continue letting the air out slowly. The sounds will become muffled and will finally disappear. Note the pressure when the sounds completely disappear. This is your diastolic blood pressure. Let out all the remaining air.  ?? Write your numbers in your log book, along with the date and time.  What else should you know about the test?  Here are the categories of blood pressure for adults:  Ideal blood pressure.    Systolic is less than 120, and diastolic is less than 80.  Elevated blood pressure.   Systolic is 120 to 129, and diastolic is less than 80.  High blood pressure (hypertension).   Systolic is 130 or above. Diastolic is 80 or above. One or both numbers may be high.  It is more accurate to take the average of several readings made throughout the day than to rely on a single reading.  Follow-up care is a key part of your treatment and safety. Be sure to make and go to all appointments, and call your doctor if you are having problems. It's also a good idea to keep a list of the medicines you take.  Where can you learn more?  Go to [email or web address].  Enter C427 in the search box to learn more about "Home Blood Pressure Test: About This Test."  Current as of: May 20, 2016  Content Version: 11.8  ?? 2006-2018 Healthwise, Incorporated. Care instructions adapted under license by [employer] [facility] [employer] (which disclaims liability or warranty for this information). If you have questions about a medical condition or this instruction, always ask your healthcare professional. Healthwise, Incorporated disclaims any warranty or liability for your use of this information.                May 31, 2017  Brandi Clark   [address]  [place] [id number]      Dear Brandi DecSara:    Thank you for requesting access to MyChart. Please follow the instructions below to view your test results, access and download parts of your medical record, view details of your past and upcoming appointments, and view your medications online.       How Do I Sign Up?              1. In  your internet browser, go to [email or web address]  2. Click on the First Time User? Click Here link in the Sign In box. You will see the New Member Sign Up page.  3. Enter your MyChart Access Code exactly as it appears below. You will not need to use this code after you???ve completed the sign-up process. If  you do not sign up before the expiration date, you must request a new code.    ?? MyChart Access Code: [email or web address]  ?? Expires: 08/29/2017  9:51 AM    4. Enter the last four digits of your Social Security Number (xxxx) and Date of Birth (mm/dd/yyyy) as indicated and click Submit. You will be taken to the next sign-up page.  5. Create a MyChart ID. This will be your MyChart login ID and cannot be changed, so think of one that is secure and easy to remember.  6. Create a MyChart password. You can change your password at any time.  7. Enter your Password Reset Question and Answer. This can be used at a later time if you forget your password.   8. Enter your e-mail address. You will receive e-mail notification when new information is available in MyChart.  9. Click Sign Up. You can now view and download portions of your medical record.  10. Click the Download Summary menu link to download a portable copy of your medical information.       Additional Information:              If you require any assistance or have any questions, please contact our MyChart Helpdesk at [phone], email at [email or web address] or check online in our Frequently Asked Questions.    Remember, MyChart is NOT to be used for urgent needs. For medical emergencies, dial 911.    Now available from your iPhone and Android!    Sincerely,   Huston Foleyorothy D Lee

## 2017-05-31 NOTE — Progress Notes (Signed)
1. Have you been to the ER, urgent care clinic since your last visit?  Hospitalized since your last visit?No    2. Have you seen or consulted any other health care providers outside of the [facility] since your last visit?  Include any pap smears or colon screening. No  Reviewed record in preparation for visit and have necessary documentation  Pt did not bring medication to office visit for review    Goals that were addressed and/or need to be completed during or after this appointment include   Health Maintenance Due   Topic Date Due   ??? Pneumococcal 19-64 Medium Risk (1 of 1 - PPSV23) 07/23/1996   ??? DTaP/Tdap/Td series (1 - Tdap) 07/23/1998

## 2017-06-01 ENCOUNTER — Encounter: Attending: Family Medicine

## 2017-06-01 LAB — METABOLIC PANEL, BASIC
BUN/Creatinine ratio: 17 (ref 9–23)
BUN: 11 mg/dL (ref 6–20)
CO2: 25 mmol/L (ref 20–29)
Calcium: 9.8 mg/dL (ref 8.7–10.2)
Chloride: 102 mmol/L (ref 96–106)
Creatinine: 0.66 mg/dL (ref 0.57–1.00)
GFR est AA: 129 mL/min/{1.73_m2} (ref >59)
GFR est non-AA: 112 mL/min/{1.73_m2} (ref >59)
Glucose: 76 mg/dL (ref 65–99)
Potassium: 3.3 mmol/L — ABNORMAL LOW (ref 3.5–5.2)
Sodium: 143 mmol/L (ref 134–144)

## 2017-06-06 ENCOUNTER — Encounter

## 2017-06-07 MED ORDER — HYDROCHLOROTHIAZIDE 25 MG TAB
25 mg | ORAL_TABLET | ORAL | 1 refills | Status: DC
Start: 2017-06-07 — End: 2017-08-06

## 2017-08-06 ENCOUNTER — Encounter

## 2017-08-06 MED ORDER — HYDROCHLOROTHIAZIDE 25 MG TAB
25 mg | ORAL_TABLET | ORAL | 1 refills | Status: DC
Start: 2017-08-06 — End: 2017-08-17

## 2017-08-17 ENCOUNTER — Ambulatory Visit: Admit: 2017-08-17 | Discharge: 2017-08-17 | Payer: PRIVATE HEALTH INSURANCE | Attending: Family Medicine

## 2017-08-17 DIAGNOSIS — E876 Hypokalemia: Secondary | ICD-10-CM

## 2017-08-17 LAB — AMB POC ISTAT CHEM 8+
Anion gap, POC: 21 MMOL/L
BUN, POC: 13 MG/DL
CO2, POC: 26 MMOL/L
Calcium, ionized (POC): 1.13
Chloride, POC: 102 MMOL/L
Creatinine, POC: 0.8 MG/DL (ref 0.6–1.3)
Glucose, POC: 106 MG/DL
Hematocrit, POC: 46 %
Hemoglobin, POC: 15.6 G/DL
Potassium, POC: 2.4 MMOL/L
Sodium, POC: 146 MMOL/L

## 2017-08-17 NOTE — Progress Notes (Signed)
I reviewed with the resident the medical history and the resident's findings on the physical examination.  I discussed with the resident the patient's diagnosis and concur with the plan. Pt with very low K on istat, no acute changes on EKG. Positive blood in stool. Will send to ER for further evaluation. 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

## 2017-08-17 NOTE — Progress Notes (Signed)
[facility]  Subjective:   Brandi Clark is a 42 y.o. female with history of Anxiety, sciaticam HTN  CC: "Black out"  History provided by patient and Records    HPI:  Patient reports yesterday had an episode of "blacking out".  Last thing she remembers was putting out her trash then didn't remember anything following this until coming inside and was SOB.  No one witnessed this event, but of note it appeared that patient had walked down about 2 houses and then returned to house, but did not remember this.  Also noting dark tarry stools yesterday and today.  Dneies current SOB, chest pain.  Patient endorse taking 400-600 mg Motrin every 4 hours.    PFSH:     Current Outpatient Medications on File Prior to Visit   Medication Sig Dispense Refill   ??? clobetasol (TEMOVATE) 0.05 % ointment Apply  to affected area two (2) times a day. To eczema on hands. 15 g 0   ??? ibuprofen (MOTRIN) 800 mg tablet Take 800 mg by mouth every four (4) hours.     ??? phenylephrine hcl (SUPHEDRINE PE) 10 mg tab Take  by mouth every four (4) hours.       No current facility-administered medications on file prior to visit.        Patient Active Problem List   Diagnosis Code   ??? Anxiety F41.9   ??? Elevated blood pressure reading without diagnosis of hypertension R03.0   ??? Sciatica, left side M54.32   ??? Chronic pain of left knee M25.562, G89.29   ??? 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 Marital status: DIVORCED     Spouse name: Not on file   ??? Number of children: Not on file   ??? Years of education: Not on file   ??? Highest education level: Not on file   Social Needs   ??? Financial resource strain: Not on file   ??? Food insecurity - worry: Not on file   ??? Food insecurity - inability: Not on file   ??? Transportation needs - medical: Not on file   ??? Transportation needs - non-medical: Not on file   Occupational History   ??? Not on file   Tobacco Use    ??? Smoking status: Smoker, Current Status Unknown     Packs/day: 1.00   ??? Smokeless tobacco: Never Used   Substance and Sexual Activity   ??? Alcohol use: No   ??? Drug use: No   ??? Sexual activity: Yes     Partners: Male   Other Topics Concern   ??? Not on file   Social History Narrative    Breast Cancer Risk Factors update 08/14/16    # of Children:  G3 P2013    Age at Delivery of First Child: 24.      Hysterectomy/oophorectomy:  No/No.      Breast Bx: No.      Hx of Breast Feeding:  no.      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     Hormone therapy: No.     FamHx of breast ca in first degree relative: No       Review of Systems   Constitutional: Negative for chills, fever and malaise/fatigue.   HENT: Negative for congestion and sore throat.    Respiratory:  Denies current SOB   Cardiovascular: Negative for chest pain and palpitations.   Gastrointestinal: Negative for abdominal pain, nausea and vomiting.   Genitourinary: Negative for dysuria.   Musculoskeletal: Negative for myalgias.   Skin: Negative for rash.   Neurological: Negative for dizziness and headaches.   Psychiatric/Behavioral: Negative for substance abuse.     Objective:     Visit Vitals  BP 145/85 (BP 1 Location: Left arm, BP Patient Position: Sitting)   Pulse (!) 102   Temp 98.6 ??F (37 ??C) (Oral)   Resp 22   Ht 5\' 6"  (1.676 m)   Wt 162 lb 3.2 oz (73.6 kg)   SpO2 97%   BMI 26.18 kg/m??          Physical Exam   Constitutional: She is oriented to person, place, and time. She appears well-developed and well-nourished. No distress.   HENT:   Head: Normocephalic and atraumatic.   Neck: Normal range of motion. Neck supple.   Cardiovascular: Normal rate, regular rhythm, normal heart sounds and intact distal pulses. Exam reveals no gallop and no friction rub.   No murmur heard.  Pulmonary/Chest: Effort normal and breath sounds normal.   Abdominal: Soft. Bowel sounds are normal. There is no tenderness.   Musculoskeletal: Normal range of motion.    Neurological: She is alert and oriented to person, place, and time. She has normal strength. No cranial nerve deficit or sensory deficit. Coordination normal.   Skin: Skin is warm and dry.   Bruising on the palmar aspects of the hands.   Psychiatric: She has a normal mood and affect. Her behavior is normal.   Nursing note and vitals reviewed.      Pertinent Labs/Studies:  I-Stat: Potassium 2.4, Hgb 15.6  FOBT: Positive  EKG: NSR, Normal intervals, normal QRS/QTC.    Assessment and orders:       ICD-10-CM ICD-9-CM    1. Hypokalemia E87.6 276.8 AMB POC EKG ROUTINE W/ 12 LEADS, INTER & REP   2. Syncope, unspecified syncope type R55 780.2    3. Fecal occult blood test positive R19.5 792.1      Diagnoses and all orders for this visit:    1. Hypokalemia/Syncope, unspecified syncope type: Syncope likely related to Hypokalemia, EKG appears normal, no comparison available.  Stopping HCTZ. Discussed with patient, patient to go by personal transport to ER for evaluation, Potassium check, repletion, and possible admission.  -     AMB POC EKG ROUTINE W/ 12 LEADS, INTER & REP     2. Fecal occult Blood test positive: Positive (Very small positive), but Hgb is wnl, likely related to Motrin use, advising to decrease NSAID use and will need to setup GI evaluation, to follow up.    Follow-up Disposition:  Return for Go to the ER immediately..    I have discussed the diagnosis with the patient and the intended plan as seen in the above orders.  Social history, medical history, and labs were reviewed.  The patient has received an after-visit summary and questions were answered concerning future plans.  I have discussed medication side effects and warnings with the patient as well.    Brandi CabalWilliam B Pascha Fogal, MD  [facility]ident [facility]  08/17/17    Patient discussed with Dr. Sanda KleinEsquivel, Attending Physician

## 2017-08-17 NOTE — Addendum Note (Signed)
Addended by: [place] MooreALEMAN, Waneda Klammer on: 08/17/2017 04:06 PM     Modules accepted: Orders

## 2017-08-17 NOTE — Progress Notes (Signed)
1. Have you been to the ER, urgent care clinic since your last visit?  Hospitalized since your last visit?No    2. Have you seen or consulted any other health care providers outside of the [facility] since your last visit?  Include any pap smears or colon screening. No  Reviewed record in preparation for visit and have necessary documentation  Pt did not bring medication to office visit for review  Information was given to pt on Advanced Directives, Living Will  Information was given on Shingles Vaccine  opportunity was given for questions  Goals that were addressed and/or need to be completed during or after this appointment include     Health Maintenance Due   Topic Date Due   ??? Pneumococcal 19-64 Medium Risk (1 of 1 - PPSV23) 07/23/1996   ??? DTaP/Tdap/Td series (1 - Tdap) 07/23/1998

## 2017-08-17 NOTE — Patient Instructions (Signed)
-   Your Potassium by I-stat is 2.4.  - Go to hospital for repeat labs and possible admission  - Please Stop your HCTZ

## 2017-08-18 ENCOUNTER — Encounter: Attending: Family Medicine

## 2017-08-31 ENCOUNTER — Encounter: Attending: Family Medicine

## 2017-09-09 ENCOUNTER — Encounter

## 2017-09-09 ENCOUNTER — Ambulatory Visit: Admit: 2017-09-09 | Discharge: 2017-09-09 | Payer: PRIVATE HEALTH INSURANCE | Attending: Family Medicine

## 2017-09-09 DIAGNOSIS — R55 Syncope and collapse: Secondary | ICD-10-CM

## 2017-09-09 NOTE — Progress Notes (Signed)
Identified pt with two pt identifiers(name and DOB). Reviewed record in preparation for visit and have obtained necessary documentation.  Chief Complaint   Patient presents with   ??? Transitions Of Care     [facility] for syncope, hypokalemia   ??? Shoulder Pain     pt reports bilateral shoulder pain since her fall; she mentioned she would like to see physical therapy Lauris Poag([facility]- Dr. Teena DunkBenson)        Health Maintenance Due   Topic   ??? Pneumococcal 0-64 years (1 of 1 - PPSV23)       Coordination of Care Questionnaire:  :   1) Have you been to an emergency room, urgent care, or hospitalized since your last visit?  If yes, where when, and reason for visit? Yes- See today's reason visit    2. Have seen or consulted any other health care provider since your last visit? If yes, where when, and reason for visit?  NO    Patient is accompanied by self I have received verbal consent from Cathlean SauerSara M Willets to discuss any/all medical information while they are present in the room.

## 2017-09-09 NOTE — Progress Notes (Signed)
Blood Glucose low, otherwise labs normal and a normal potassium, no changes to current regimen.  At time denied any abnormal fatigue, headaches, or other issues.  Repeat BMP on follow up.

## 2017-09-09 NOTE — Progress Notes (Signed)
[facility]  [address]  [place], [place] [address]  [phone]    Transition of Care Visit  Patient: Brandi Clark MRN: [id number]  SSN: [id number]    Date of Birth: March 29, 1976  Age: 42 y.o.  Sex: female    Hospital: [facility]  Dates of admission: 08/17/17-08/18/17  Discharge diagnoses: Syncope, Hypokalemia, Melena, Hypercalcemia, sciatica  State of health at discharge: Improved/Stable  Surgical or invasive procedures done: None    Amount and/or Complexity of Data Reviewed:   Clinical lab tests: Reviewed or ordered   Tests in the radiology section: reviewed or ordered  Discussion of test results with the patient-yes  Obtain previous medical records or obtain history from someone other than the patient: No  Obtain history from someone other than the patient: no  Review and address past medical records: yes  Discuss the patient with another provider: no  Independant visualization of image, tracing, or specimen: no    Risk of Significant Complications, Morbidity, and/or Mortality:   Presenting problems: High   Diagnostic procedures: Moderate   Management options: Moderate     Transition of Care time spent:   Total time providing care and documentation: 40-60 minutes   Progress at discharge:   Stable on Discharge    Progress Note    Patient: Brandi Clark MRN: [id number]  SSN: [id number]    Date of Birth: 1976/01/29  Age: 42 y.o.  Sex: female        Chief Complaint   Patient presents with   ??? Transitions Of Care     [facility] 3/5-08/18/17 for syncope, hypokalemia   ??? Shoulder Pain     pt reports bilateral shoulder pain since her fall; she mentioned she would like to see physical therapy [facility]- Dr. Teena Dunk)         Subjective:     Encounter Diagnoses   Name Primary?   ??? Syncope, unspecified syncope type Yes   ??? Hypokalemia    ??? Hospital discharge follow-up    ??? Melena       Patient had syncopal episode on day of admission with hypokalemia (K=2.4) identified on labs at clinic and immediately sent to ED.  Admitted for hypokalemia with ED Potassium of 2.6.  Had potassium repletion during admission and had Echocardiogram (Normal) given syncope.   Discharged the next day with Potassium of 3.2.  HCTZ was stopped and patient was recommended to start potassium supplement which she has. TOday reports feeling normal and denies any fatigue, palpitations or other abnormalities.     Also during admission Hgb was stable, had noted Melena and since discharge denies any dark stools.  Mildly elevated Calcium also noted and recommended follow up OP.  Patient has also stopped smoking, doing well with current nicotine patches.  Only significant complaint is bilateral shoulder pain following fall.    EKG: Normal Sinus with non-specific changes  CXR: Negative  Echocardiogram: EF of 55-60%, No WMA, Otherwise normal study.      Current and past medical information:    Current Medications after this visit::     Current Outpatient Medications   Medication Sig   ??? potassium 99 mg tablet Take 99 mg by mouth daily. Indications: Pt takes 0.5 tab daily   ??? dihydroxyaluminum sodium carb (ANTACID RELIEF PO) Take 1 Tab by mouth daily. Indications: OTC (Equate brand)   ??? nicotine (NICODERM CQ) 21 mg/24 hr 1 Patch by TransDERmal route every twenty-four (  24) hours.   ??? acetaminophen (TYLENOL ARTHRITIS PAIN) 650 mg TbER Take 650 mg by mouth every eight (8) hours.   ??? clobetasol (TEMOVATE) 0.05 % ointment Apply  to affected area two (2) times a day. To eczema on hands. (Patient not taking: Reported on 09/09/2017)   ??? ibuprofen (MOTRIN) 800 mg tablet Take 800 mg by mouth every four (4) hours.   ??? phenylephrine hcl (SUPHEDRINE PE) 10 mg tab Take  by mouth every four (4) hours.     No current facility-administered medications for this visit.        Patient Active Problem List    Diagnosis Date Noted    ??? Chronic pain of left knee 12/29/2016   ??? 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 Anxiety 07/31/2016   ??? Elevated blood pressure reading without diagnosis of hypertension 07/31/2016   ??? Sciatica, left side 07/31/2016       Past Medical History:   Diagnosis Date   ??? Anxiety    ??? 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        Allergies   Allergen Reactions   ??? Codeine Rash and Nausea Only     Other reaction(s): GI intolerance       Past Surgical History:   Procedure Laterality Date   ??? HX TUBAL LIGATION Bilateral 2003       Social History     Socioeconomic History   ??? Marital status: DIVORCED     Spouse name: Not on file   ??? Number of children: Not on file   ??? Years of education: Not on file   ??? Highest education level: Not on file   Tobacco Use   ??? Smoking status: Former Smoker     Packs/day: 1.00     Types: Cigarettes     Last attempt to quit: 08/18/2017     Years since quitting: 0.0   ??? Smokeless tobacco: Never Used   Substance and Sexual Activity   ??? Alcohol use: No   ??? Drug use: No     Types: IV   ??? Sexual activity: Yes     Partners: Male   Social History Narrative    Breast Cancer Risk Factors update 08/14/16    # of Children:  G3 P2013    Age at Delivery of First Child: 24.      Hysterectomy/oophorectomy:  No/No.      Breast Bx: No.      Hx of Breast Feeding:  no.      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     Hormone therapy: No.     FamHx of breast ca in first degree relative: No       Review of Systems   Constitutional: Negative for malaise/fatigue.   HENT: Negative for congestion.    Respiratory: Negative for cough and shortness of breath.    Cardiovascular: Negative for chest pain and palpitations.   Gastrointestinal: Negative for nausea.   Musculoskeletal: Negative for myalgias.   Skin: Negative for rash.   Neurological: Negative for dizziness, loss of consciousness and headaches.       Objective:     Vitals:    09/09/17 1110   BP: 121/74   Pulse: 69   Resp: 16    Temp: 97.7 ??F (36.5 ??C)   TempSrc: Oral   SpO2: 97%   Weight: 172 lb (78 kg)   Height: 5'  6" (1.676 m)      Body mass index is 27.76 kg/m??.      Physical Exam   Constitutional: She is oriented to person, place, and time. She appears well-developed and well-nourished. No distress.   HENT:   Head: Normocephalic and atraumatic.   Neck: Normal range of motion. Neck supple.   Cardiovascular: Normal rate, regular rhythm, normal heart sounds and intact distal pulses. Exam reveals no gallop and no friction rub.   No murmur heard.  Pulmonary/Chest: Effort normal and breath sounds normal.   Abdominal: Soft. Bowel sounds are normal. She exhibits no distension. There is no tenderness.   Musculoskeletal: Normal range of motion. She exhibits tenderness. She exhibits no deformity.   Noting mild tenderness in the shoulders bilaterally, but Right > left.  Increased pain with rotation and supination against resistance in the right shoulder.  5/5 strength bilaterally.   Neurological: She is alert and oriented to person, place, and time.   Skin: Skin is warm and dry.   Psychiatric: She has a normal mood and affect. Her behavior is normal.   Nursing note and vitals reviewed.      Health Maintenance Due   Topic Date Due   ??? Pneumococcal 0-64 years (1 of 1 - PPSV23) 07/24/1983       Assessment and orders:       ICD-10-CM ICD-9-CM    1. Syncope, unspecified syncope type R55 780.2    2. Hypokalemia E87.6 276.8 METABOLIC PANEL, COMPREHENSIVE   3. Hospital discharge follow-up Z09 V67.59    4. Melena K92.1 578.1 CBC W/O DIFF   5. Acute pain of both shoulders M25.511 719.41 REFERRAL TO PHYSICAL THERAPY    M25.512         Diagnoses and all orders for this visit:    1. Syncope, unspecified syncope type/Hypokalemia: Primarily likely etiology of syncope remains Hypokalemia.  Blood pressure is well controlled at this time.  Continuing Potassium supplement for now, repeat CMP today.  Also will see if persistent Hypercalcemia.   -     METABOLIC PANEL, COMPREHENSIVE; Future    2. Hospital discharge follow-up    3. Melena: Resolved per patient.  CBC today.  If Hgb lower or Melena returns will refer for GI evaluation.  -     CBC W/O DIFF; Future    4. Acute pain of both shoulders: Likely related to fall during syncopal episode.  Likely strain/sprain.  PT evaluation at this time.  -     REFERRAL TO PHYSICAL THERAPY      Follow-up and Dispositions    ?? Return in about 1 month (around 10/07/2017).             Plan of care:  Discussed diagnoses in detail with patient.     Medication risks/benefits/side effects discussed with patient.     All of the patient's questions were addressed. The patient understands and agrees with our plan of care.    The patient knows to call back if they are unsure of or forget any changes we discussed today or if the symptoms change.     The patient received an After-Visit Summary which contains VS, orders, medication list and allergy list. This can be used as a "mini-medical record" should they have to seek medical care while out of town.        Future Appointments   Date Time Provider Department Center   10/12/2017  9:40 AM Altamese CabalEggleston, Nile Prisk B, MD [facility] SCHED  Signed By: Altamese Cabal, MD     September 09, 2017        Patient discussed with Dr. Jearl Klinefelter, Attending Physician

## 2017-09-09 NOTE — Progress Notes (Signed)
I discussed the findings, assessment and plan in detail with the resident and agree with the resident's findings and plan as documented in the resident's note.    Jamesetta Greenhalgh N. Venesha Petraitis, M.D.

## 2017-09-10 LAB — METABOLIC PANEL, COMPREHENSIVE
A-G Ratio: 1.7 (ref 1.2–2.2)
ALT (SGPT): 10 IU/L (ref 0–32)
AST (SGOT): 17 IU/L (ref 0–40)
Albumin: 4.7 g/dL (ref 3.5–5.5)
Alk. phosphatase: 47 IU/L (ref 39–117)
BUN/Creatinine ratio: 14 (ref 9–23)
BUN: 10 mg/dL (ref 6–24)
Bilirubin, total: 0.3 mg/dL (ref 0.0–1.2)
CO2: 23 mmol/L (ref 20–29)
Calcium: 10.1 mg/dL (ref 8.7–10.2)
Chloride: 106 mmol/L (ref 96–106)
Creatinine: 0.7 mg/dL (ref 0.57–1.00)
GFR est AA: 125 mL/min/{1.73_m2} (ref >59)
GFR est non-AA: 109 mL/min/{1.73_m2} (ref >59)
GLOBULIN, TOTAL: 2.8 g/dL (ref 1.5–4.5)
Glucose: 52 mg/dL — ABNORMAL LOW (ref 65–99)
Potassium: 4.3 mmol/L (ref 3.5–5.2)
Protein, total: 7.5 g/dL (ref 6.0–8.5)
Sodium: 143 mmol/L (ref 134–144)

## 2017-09-10 LAB — CBC W/O DIFF
HCT: 40.7 % (ref 34.0–46.6)
HGB: 13 g/dL (ref 11.1–15.9)
MCH: 29.2 pg (ref 26.6–33.0)
MCHC: 31.9 g/dL (ref 31.5–35.7)
MCV: 92 fL (ref 79–97)
PLATELET: 304 10*3/uL (ref 150–379)
RBC: 4.45 x10E6/uL (ref 3.77–5.28)
RDW: 13.9 % (ref 12.3–15.4)
WBC: 9.3 10*3/uL (ref 3.4–10.8)

## 2017-10-12 ENCOUNTER — Encounter: Attending: Family Medicine

## 2018-02-21 ENCOUNTER — Encounter: Attending: Family Medicine

## 2018-03-14 ENCOUNTER — Ambulatory Visit: Admit: 2018-03-14 | Discharge: 2018-03-14 | Payer: PRIVATE HEALTH INSURANCE | Attending: Family Medicine

## 2018-03-14 ENCOUNTER — Ambulatory Visit: Attending: Family Medicine | Primary: Family Medicine

## 2018-03-14 DIAGNOSIS — E876 Hypokalemia: Secondary | ICD-10-CM

## 2018-03-14 NOTE — Progress Notes (Signed)
[facility]    History of Present Illness:   Brandi Clark is a 42 y.o. female with history of Hypokalemia, Syncopal episodes  CC: Follow up Hypokalemia, Eczema  History provided by patient and Records    HPI:  Hypokalemia/Syncope:  No further events, Continuing Potassium Supplement.  Feels well without complaints.    Dry skin on the left hand:  Irritated by dryness/ heat.  Improved with lotion.  Irritated by Clobetasol cream previously.    Smoking cessation:  Has stopped smoking and is no longer using patches either.    Health Maintenance  Health Maintenance Due   Topic Date Due   ??? Influenza Age 60 to Adult  01/13/2018       Past Family and Social History:     Current Outpatient Medications on File Prior to Visit   Medication Sig Dispense Refill   ??? potassium 99 mg tablet Take 99 mg by mouth daily. Indications: Pt takes 0.5 tab daily     ??? dihydroxyaluminum sodium carb (ANTACID RELIEF PO) Take 1 Tab by mouth daily. Indications: OTC (Equate brand)     ??? acetaminophen (TYLENOL ARTHRITIS PAIN) 650 mg TbER Take 650 mg by mouth every eight (8) hours.     ??? ibuprofen (MOTRIN) 800 mg tablet Take 800 mg by mouth every four (4) hours.     ??? nicotine (NICODERM CQ) 21 mg/24 hr 1 Patch by TransDERmal route every twenty-four (24) hours.     ??? clobetasol (TEMOVATE) 0.05 % ointment Apply  to affected area two (2) times a day. To eczema on hands. (Patient not taking: Reported on 09/09/2017) 15 g 0   ??? phenylephrine hcl (SUPHEDRINE PE) 10 mg tab Take  by mouth every four (4) hours.       No current facility-administered medications on file prior to visit.        Patient Active Problem List   Diagnosis Code   ??? Anxiety F41.9   ??? Elevated blood pressure reading without diagnosis of hypertension R03.0   ??? Chronic pain of left knee M25.562, G89.29   ??? 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 Eczema L30.9       Social History     Socioeconomic History   ??? Marital status:  DIVORCED     Spouse name: Not on file   ??? Number of children: Not on file   ??? Years of education: Not on file   ??? Highest education level: Not on file   Occupational History   ??? Not on file   Social Needs   ??? Financial resource strain: Not on file   ??? Food insecurity:     Worry: Not on file     Inability: Not on file   ??? Transportation needs:     Medical: Not on file     Non-medical: Not on file   Tobacco Use   ??? Smoking status: Former Smoker     Packs/day: 1.00     Types: Cigarettes     Last attempt to quit: 08/18/2017     Years since quitting: 0.5   ??? Smokeless tobacco: Never Used   Substance and Sexual Activity   ??? Alcohol use: No   ??? Drug use: No     Types: IV   ??? Sexual activity: Yes     Partners: Male   Lifestyle   ??? Physical activity:     Days per week: Not on file  Minutes per session: Not on file   ??? Stress: Not on file   Relationships   ??? Social connections:     Talks on phone: Not on file     Gets together: Not on file     Attends religious service: Not on file     Active member of club or organization: Not on file     Attends meetings of clubs or organizations: Not on file     Relationship status: Not on file   ??? Intimate partner violence:     Fear of current or ex partner: Not on file     Emotionally abused: Not on file     Physically abused: Not on file     Forced sexual activity: Not on file   Other Topics Concern   ??? Not on file   Social History Narrative    Breast Cancer Risk Factors update 08/14/16    # of Children:  G3 P2013    Age at Delivery of First Child: 24.      Hysterectomy/oophorectomy:  No/No.      Breast Bx: No.      Hx of Breast Feeding:  no.      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     Hormone therapy: No.     FamHx of breast ca in first degree relative: No       Review of Systems   Review of Systems   Cardiovascular: Negative for chest pain and palpitations.   Gastrointestinal: Negative for nausea and vomiting.   Neurological: Negative for dizziness, seizures, loss of  consciousness and headaches.   Psychiatric/Behavioral: Negative for depression and substance abuse. The patient is not nervous/anxious.        Objective:     Visit Vitals  BP 121/87   Pulse 82   Temp 98 ??F (36.7 ??C) (Oral)   Resp 16   Ht 5\' 6"  (1.676 m)   Wt 176 lb (79.8 kg)   SpO2 98%   BMI 28.41 kg/m??        Physical Exam   Constitutional: She is oriented to person, place, and time. She appears well-developed and well-nourished. No distress.   HENT:   Head: Normocephalic and atraumatic.   Cardiovascular: Normal rate, regular rhythm, normal heart sounds and intact distal pulses. Exam reveals no gallop and no friction rub.   No murmur heard.  Pulmonary/Chest: Effort normal and breath sounds normal.   Abdominal: Soft. Bowel sounds are normal. She exhibits no distension. There is no tenderness.   Musculoskeletal: Normal range of motion. She exhibits no edema.   Neurological: She is alert and oriented to person, place, and time.   Skin: Skin is warm and dry.   Dryness on the palmar aspect of the left hand   Psychiatric: She has a normal mood and affect. Her behavior is normal.   Nursing note and vitals reviewed.      Pertinent Labs/Studies:      Assessment and orders:       ICD-10-CM ICD-9-CM    1. Hypokalemia E87.6 276.8 METABOLIC PANEL, BASIC      CBC W/O DIFF   2. Encounter for immunization Z23 V03.89 INFLUENZA VIRUS VAC QUAD,SPLIT,PRESV FREE SYRINGE IM      PR IMMUNIZ ADMIN,1 SINGLE/COMB VAC/TOXOID   3. Former smoker Z87.891 V15.82    4. Skin rash R21 782.1      Diagnoses and all orders for this visit:    1. Hypokalemia: Appears  stable, labs today if normal repeat in 6-12 months.  -     METABOLIC PANEL, BASIC  -     CBC W/O DIFF    2. Encounter for immunization  -     INFLUENZA VIRUS VAC QUAD,SPLIT,PRESV FREE SYRINGE IM  -     PR IMMUNIZ ADMIN,1 SINGLE/COMB VAC/TOXOID    3. Former smoker: Doing well, encouraging continued cessation of smoking    4. Skin Rash:  Appears Eczema, but noting Clobetasol caused  irritation.  Trial of OTC Eucerin recommended.    Follow-up and Dispositions    ?? Return in about 6 months (around 09/12/2018).           I have discussed the diagnosis with the patient and the intended plan as seen in the above orders.  Social history, medical history, and labs were reviewed.  The patient has received an after-visit summary and questions were answered concerning future plans.  I have discussed medication side effects and warnings with the patient as well.    Altamese Cabal, MD  [facility]  03/14/18

## 2018-03-14 NOTE — Progress Notes (Signed)
Unremarkable.

## 2018-03-14 NOTE — Progress Notes (Signed)
 1. Have you been to the ER, urgent care clinic, or been hospitalized since your last visit?   No     2. Have you seen or consulted any other health care providers outside of the [facility] since your last visit?   no     Reviewed record in preparation for visit and have necessary documentation  Goals that were addressed and/or need to be completed during or after this appointment include   Health Maintenance Due   Topic Date Due   . Influenza Age 22 to Adult  01/13/2018       Patient is accompanied by self I have received verbal consent from Brandi Clark to discuss any/all medical information while they are present in the room.

## 2018-03-14 NOTE — Progress Notes (Signed)
[facility]    History of Present Illness:   Brandi Clark is a 42 y.o. female with history of Hypokalemia, Syncopal episodes  CC: Follow up Hypokalemia, Eczema  History provided by patient and Records    HPI:  Hypokalemia/Syncope:  No further events, Continuing Potassium Supplement.  Feels well without complaints.    Dry skin on the left hand:  Irritated by dryness/ heat.  Improved with lotion.  Irritated by Clobetasol cream previously.    Smoking cessation:  Has stopped smoking and is no longer using patches either.    Health Maintenance  Health Maintenance Due   Topic Date Due   ??? Influenza Age 55 to Adult  01/13/2018       Past Family and Social History:     Current Outpatient Medications on File Prior to Visit   Medication Sig Dispense Refill   ??? potassium 99 mg tablet Take 99 mg by mouth daily. Indications: Pt takes 0.5 tab daily     ??? dihydroxyaluminum sodium carb (ANTACID RELIEF PO) Take 1 Tab by mouth daily. Indications: OTC (Equate brand)     ??? acetaminophen (TYLENOL ARTHRITIS PAIN) 650 mg TbER Take 650 mg by mouth every eight (8) hours.     ??? ibuprofen (MOTRIN) 800 mg tablet Take 800 mg by mouth every four (4) hours.     ??? nicotine (NICODERM CQ) 21 mg/24 hr 1 Patch by TransDERmal route every twenty-four (24) hours.     ??? clobetasol (TEMOVATE) 0.05 % ointment Apply  to affected area two (2) times a day. To eczema on hands. (Patient not taking: Reported on 09/09/2017) 15 g 0   ??? phenylephrine hcl (SUPHEDRINE PE) 10 mg tab Take  by mouth every four (4) hours.       No current facility-administered medications on file prior to visit.        Patient Active Problem List   Diagnosis Code   ??? Anxiety F41.9   ??? Elevated blood pressure reading without diagnosis of hypertension R03.0   ??? Chronic pain of left knee M25.562, G89.29   ??? 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 Eczema L30.9       Social History     Socioeconomic History    ??? Marital status: DIVORCED     Spouse name: Not on file   ??? Number of children: Not on file   ??? Years of education: Not on file   ??? Highest education level: Not on file   Occupational History   ??? Not on file   Social Needs   ??? Financial resource strain: Not on file   ??? Food insecurity:     Worry: Not on file     Inability: Not on file   ??? Transportation needs:     Medical: Not on file     Non-medical: Not on file   Tobacco Use   ??? Smoking status: Former Smoker     Packs/day: 1.00     Types: Cigarettes     Last attempt to quit: 08/18/2017     Years since quitting: 0.5   ??? Smokeless tobacco: Never Used   Substance and Sexual Activity   ??? Alcohol use: No   ??? Drug use: No     Types: IV   ??? Sexual activity: Yes     Partners: Male   Lifestyle   ??? Physical activity:     Days per week: Not on file  Minutes per session: Not on file   ??? Stress: Not on file   Relationships   ??? Social connections:     Talks on phone: Not on file     Gets together: Not on file     Attends religious service: Not on file     Active member of club or organization: Not on file     Attends meetings of clubs or organizations: Not on file     Relationship status: Not on file   ??? Intimate partner violence:     Fear of current or ex partner: Not on file     Emotionally abused: Not on file     Physically abused: Not on file     Forced sexual activity: Not on file   Other Topics Concern   ??? Not on file   Social History Narrative    Breast Cancer Risk Factors update 08/14/16    # of Children:  G3 P2013    Age at Delivery of First Child: 24.      Hysterectomy/oophorectomy:  No/No.      Breast Bx: No.      Hx of Breast Feeding:  no.      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     Hormone therapy: No.     FamHx of breast ca in first degree relative: No       Review of Systems   Review of Systems   Cardiovascular: Negative for chest pain and palpitations.   Gastrointestinal: Negative for nausea and vomiting.    Neurological: Negative for dizziness, seizures, loss of consciousness and headaches.   Psychiatric/Behavioral: Negative for depression and substance abuse. The patient is not nervous/anxious.        Objective:     Visit Vitals  BP 121/87   Pulse 82   Temp 98 ??F (36.7 ??C) (Oral)   Resp 16   Ht 5\' 6"  (1.676 m)   Wt 176 lb (79.8 kg)   SpO2 98%   BMI 28.41 kg/m??        Physical Exam   Constitutional: She is oriented to person, place, and time. She appears well-developed and well-nourished. No distress.   HENT:   Head: Normocephalic and atraumatic.   Cardiovascular: Normal rate, regular rhythm, normal heart sounds and intact distal pulses. Exam reveals no gallop and no friction rub.   No murmur heard.  Pulmonary/Chest: Effort normal and breath sounds normal.   Abdominal: Soft. Bowel sounds are normal. She exhibits no distension. There is no tenderness.   Musculoskeletal: Normal range of motion. She exhibits no edema.   Neurological: She is alert and oriented to person, place, and time.   Skin: Skin is warm and dry.   Dryness on the palmar aspect of the left hand   Psychiatric: She has a normal mood and affect. Her behavior is normal.   Nursing note and vitals reviewed.      Pertinent Labs/Studies:      Assessment and orders:       ICD-10-CM ICD-9-CM    1. Hypokalemia E87.6 276.8 METABOLIC PANEL, BASIC      CBC W/O DIFF   2. Encounter for immunization Z23 V03.89 INFLUENZA VIRUS VAC QUAD,SPLIT,PRESV FREE SYRINGE IM      PR IMMUNIZ ADMIN,1 SINGLE/COMB VAC/TOXOID   3. Former smoker Z87.891 V15.82    4. Skin rash R21 782.1      Diagnoses and all orders for this visit:    1. Hypokalemia: Appears  stable, labs today if normal repeat in 6-12 months.  -     METABOLIC PANEL, BASIC  -     CBC W/O DIFF    2. Encounter for immunization  -     INFLUENZA VIRUS VAC QUAD,SPLIT,PRESV FREE SYRINGE IM  -     PR IMMUNIZ ADMIN,1 SINGLE/COMB VAC/TOXOID    3. Former smoker: Doing well, encouraging continued cessation of smoking     4. Skin Rash:  Appears Eczema, but noting Clobetasol caused irritation.  Trial of OTC Eucerin recommended.    Follow-up and Dispositions    ?? Return in about 6 months (around 09/12/2018).           I have discussed the diagnosis with the patient and the intended plan as seen in the above orders.  Social history, medical history, and labs were reviewed.  The patient has received an after-visit summary and questions were answered concerning future plans.  I have discussed medication side effects and warnings with the patient as well.    Altamese Cabal, MD  [facility]  03/14/18

## 2018-03-14 NOTE — Patient Instructions (Addendum)
Eucerin Cream on hands

## 2018-03-14 NOTE — Progress Notes (Signed)
1. Have you been to the ER, urgent care clinic, or been hospitalized since your last visit?   No     2. Have you seen or consulted any other health care providers outside of the [facility] since your last visit?   no     Reviewed record in preparation for visit and have necessary documentation  Goals that were addressed and/or need to be completed during or after this appointment include   Health Maintenance Due   Topic Date Due   ??? Influenza Age 11 to Adult  01/13/2018       Patient is accompanied by self I have received verbal consent from Brandi Clark to discuss any/all medical information while they are present in the room.

## 2018-03-15 LAB — BASIC METABOLIC PANEL
BUN/Creatinine Ratio: 12 NA (ref 9–23)
BUN: 9 mg/dL (ref 6–24)
CO2: 23 mmol/L (ref 20–29)
Calcium: 8.9 mg/dL (ref 8.7–10.2)
Chloride: 106 mmol/L (ref 96–106)
Creatinine: 0.76 mg/dL (ref 0.57–1.00)
GFR African American: 113 mL/min/{1.73_m2} (ref >59)
Glucose: 76 mg/dL (ref 65–99)
Potassium: 3.9 mmol/L (ref 3.5–5.2)
Sodium: 142 mmol/L (ref 134–144)
eGFR NON-AA: 98 mL/min/{1.73_m2} (ref >59)

## 2018-03-15 LAB — CBC
Hematocrit: 35.4 % (ref 34.0–46.6)
Hemoglobin: 12.1 g/dL (ref 11.1–15.9)
MCH: 30.6 pg (ref 26.6–33.0)
MCHC: 34.2 g/dL (ref 31.5–35.7)
MCV: 89 fL (ref 79–97)
Platelets: 214 10*3/uL (ref 150–450)
RBC: 3.96 x10E6/uL (ref 3.77–5.28)
RDW: 11.7 % — ABNORMAL LOW (ref 12.3–15.4)
WBC: 7.9 10*3/uL (ref 3.4–10.8)

## 2018-03-15 LAB — CBC W/O DIFF
HCT: 35.4 % (ref 34.0–46.6)
HGB: 12.1 g/dL (ref 11.1–15.9)
MCH: 30.6 pg (ref 26.6–33.0)
MCHC: 34.2 g/dL (ref 31.5–35.7)
MCV: 89 fL (ref 79–97)
PLATELET: 214 10*3/uL (ref 150–450)
RBC: 3.96 x10E6/uL (ref 3.77–5.28)
RDW: 11.7 % — ABNORMAL LOW (ref 12.3–15.4)
WBC: 7.9 10*3/uL (ref 3.4–10.8)

## 2018-03-15 LAB — METABOLIC PANEL, BASIC
BUN/Creatinine ratio: 12 (ref 9–23)
BUN: 9 mg/dL (ref 6–24)
CO2: 23 mmol/L (ref 20–29)
Calcium: 8.9 mg/dL (ref 8.7–10.2)
Chloride: 106 mmol/L (ref 96–106)
Creatinine: 0.76 mg/dL (ref 0.57–1.00)
GFR est AA: 113 mL/min/{1.73_m2} (ref >59)
GFR est non-AA: 98 mL/min/{1.73_m2} (ref >59)
Glucose: 76 mg/dL (ref 65–99)
Potassium: 3.9 mmol/L (ref 3.5–5.2)
Sodium: 142 mmol/L (ref 134–144)

## 2018-09-12 ENCOUNTER — Ambulatory Visit
Admit: 2018-09-12 | Discharge: 2018-09-12 | Payer: PRIVATE HEALTH INSURANCE | Attending: Family Medicine | Primary: Family Medicine

## 2018-09-12 ENCOUNTER — Encounter: Attending: Family Medicine | Primary: Family Medicine

## 2018-09-12 ENCOUNTER — Ambulatory Visit: Attending: Family Medicine | Primary: Family Medicine

## 2018-09-12 DIAGNOSIS — L308 Other specified dermatitis: Secondary | ICD-10-CM

## 2018-09-12 MED ORDER — TRIAMCINOLONE ACETONIDE 0.025 % TOPICAL CREAM
0.025 % | Freq: Two times a day (BID) | CUTANEOUS | 0 refills | Status: DC
Start: 2018-09-12 — End: 2020-04-16

## 2018-09-12 NOTE — Progress Notes (Signed)
Chief Complaint   Patient presents with   . Follow-up     routine     Visit Vitals  BP (!) 145/95 (BP 1 Location: Left arm, BP Patient Position: Sitting)   Pulse 72   Temp 97.3 F (36.3 C) (Oral)   Resp 18   Ht 5\' 6"  (1.676 m)   Wt 195 lb 6.4 oz (88.6 kg)   SpO2 99%   BMI 31.54 kg/m     1. Have you been to the ER, urgent care clinic since your last visit?  Hospitalized since your last visit?No    2. Have you seen or consulted any other health care providers outside of the [facility] since your last visit?  Include any pap smears or colon screening. No    Reviewed record in preparation for visit and have necessary documentation  Pt did not bring medication to office visit for review  opportunity was given for questions  Goals that were addressed and/or need to be completed during or after this appointment include   There are no preventive care reminders to display for this patient.

## 2018-09-12 NOTE — Progress Notes (Signed)
[facility]    History of Present Illness:   Brandi Clark is a 43 y.o. female with history of Eczema, Anxiety, Low back pain  CC: Follow up chronic conditions  History provided by patient and Records    HPI:  Eczema/Atopic Dermatitis Follow up:  Patient is following up for eczema which is currently moderately well controlled with Jurgen's.  Patient generally has exacerbations several a year.  Exacerbations generally caused by Dry skin, hand sanitizer.  Associated conditions are well controlled at this time.     Low back Pain with Sciatica: Improved at this time.  Takes Ibuprofen as needed.  Occurred after accident involving Dog.      Hypokalemia: Patient still taking Potassium Supplement     Health Maintenance  There are no preventive care reminders to display for this patient.    Past Medical, Family, and Social History:     Current Outpatient Medications on File Prior to Visit   Medication Sig Dispense Refill   ??? potassium 99 mg tablet Take 99 mg by mouth daily. Indications: Pt takes 0.5 tab daily     ??? ibuprofen (MOTRIN) 800 mg tablet Take 800 mg by mouth every four (4) hours.     ??? [DISCONTINUED] dihydroxyaluminum sodium carb (ANTACID RELIEF PO) Take 1 Tab by mouth daily. Indications: OTC (Equate brand)     ??? [DISCONTINUED] nicotine (NICODERM CQ) 21 mg/24 hr 1 Patch by TransDERmal route every twenty-four (24) hours.     ??? [DISCONTINUED] acetaminophen (TYLENOL ARTHRITIS PAIN) 650 mg TbER Take 650 mg by mouth every eight (8) hours.     ??? [DISCONTINUED] clobetasol (TEMOVATE) 0.05 % ointment Apply  to affected area two (2) times a day. To eczema on hands. (Patient not taking: Reported on 09/09/2017) 15 g 0   ??? [DISCONTINUED] phenylephrine hcl (SUPHEDRINE PE) 10 mg tab Take  by mouth every four (4) hours.       No current facility-administered medications on file prior to visit.        Patient Active Problem List   Diagnosis Code   ??? Anxiety F41.9   ??? Elevated blood pressure reading without  diagnosis of hypertension R03.0   ??? Chronic pain of left knee M25.562, G89.29   ??? 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 Eczema L30.9       Social History     Socioeconomic History   ??? Marital status: DIVORCED     Spouse name: Not on file   ??? Number of children: Not on file   ??? Years of education: Not on file   ??? Highest education level: Not on file   Occupational History   ??? Not on file   Social Needs   ??? Financial resource strain: Not on file   ??? Food insecurity     Worry: Not on file     Inability: Not on file   ??? Transportation needs     Medical: Not on file     Non-medical: Not on file   Tobacco Use   ??? Smoking status: Former Smoker     Packs/day: 1.00     Types: Cigarettes     Last attempt to quit: 08/18/2017     Years since quitting: 1.0   ??? Smokeless tobacco: Never Used   Substance and Sexual Activity   ??? Alcohol use: No   ??? Drug use: No     Types: IV   ??? Sexual  activity: Yes     Partners: Male   Lifestyle   ??? Physical activity     Days per week: Not on file     Minutes per session: Not on file   ??? Stress: Not on file   Relationships   ??? Social Wellsite geologist on phone: Not on file     Gets together: Not on file     Attends religious service: Not on file     Active member of club or organization: Not on file     Attends meetings of clubs or organizations: Not on file     Relationship status: Not on file   ??? Intimate partner violence     Fear of current or ex partner: Not on file     Emotionally abused: Not on file     Physically abused: Not on file     Forced sexual activity: Not on file   Other Topics Concern   ??? Not on file   Social History Narrative    Breast Cancer Risk Factors update 08/14/16    # of Children:  G3 P2013    Age at Delivery of First Child: 24.      Hysterectomy/oophorectomy:  No/No.      Breast Bx: No.      Hx of Breast Feeding:  no.      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     Hormone therapy: No.     FamHx of breast ca in first degree  relative: No       Review of Systems   Review of Systems   Constitutional: Negative for chills and fever.   Cardiovascular: Negative for chest pain and palpitations.   Gastrointestinal: Negative for abdominal pain, nausea and vomiting.   Skin: Positive for itching and rash.       Objective:     Visit Vitals  BP 144/86 (BP 1 Location: Left arm, BP Patient Position: Sitting)   Pulse 72   Temp 97.3 ??F (36.3 ??C) (Oral)   Resp 18   Ht 5\' 6"  (1.676 m)   Wt 195 lb 6.4 oz (88.6 kg)   SpO2 99%   BMI 31.54 kg/m??        Physical Exam  Vitals signs and nursing note reviewed.   Constitutional:       Appearance: Normal appearance.   HENT:      Head: Normocephalic and atraumatic.   Neck:      Musculoskeletal: Normal range of motion and neck supple.   Cardiovascular:      Rate and Rhythm: Normal rate and regular rhythm.      Pulses: Normal pulses.      Heart sounds: Normal heart sounds. No murmur. No friction rub. No gallop.    Pulmonary:      Effort: Pulmonary effort is normal.      Breath sounds: Normal breath sounds.   Abdominal:      General: Abdomen is flat. Bowel sounds are normal.      Palpations: Abdomen is soft.   Skin:     Comments: Area of itching, Erythema, and pruritis on the left hand   Neurological:      Mental Status: She is alert.       Pertinent Labs/Studies:      Assessment and orders:       ICD-10-CM ICD-9-CM    1. Other eczema L30.8 692.9 triamcinolone acetonide (KENALOG) 0.025 % topical cream  2. Chronic bilateral low back pain with left-sided sciatica M54.42 724.2     G89.29 724.3      338.29    3. Hypokalemia E87.6 276.8    4. Elevated blood pressure reading without diagnosis of hypertension R03.0 796.2      Diagnoses and all orders for this visit:    1. Other eczema: Starting treatment with Kenalog cream.  Continue Moisturizer.    -     triamcinolone acetonide (KENALOG) 0.025 % topical cream; Apply  to affected area two (2) times a day. use thin layer    2. Chronic bilateral low back pain with left-sided  sciatica: Condition and symptoms are well controlled at this time.  No changes to therapy at this time.     3. Hypokalemia: Continue Potassium    4. Elevated blood pressure reading without diagnosis of hypertension: Continue monitoring.  On follow up if elevated will start treatment.      Follow-up and Dispositions    ?? Return in about 6 months (around 03/15/2019) for Chronic Care.           I have discussed the diagnosis with the patient and the intended plan as seen in the above orders.  Social history, medical history, and labs were reviewed.  The patient has received an after-visit summary and questions were answered concerning future plans.  I have discussed medication side effects and warnings with the patient as well.    Altamese Cabal, MD  [facility]  09/12/18

## 2018-09-12 NOTE — Progress Notes (Signed)
Chief Complaint   Patient presents with   ??? Follow-up     routine     Visit Vitals  BP (!) 145/95 (BP 1 Location: Left arm, BP Patient Position: Sitting)   Pulse 72   Temp 97.3 ??F (36.3 ??C) (Oral)   Resp 18   Ht 5' 6" (1.676 m)   Wt 195 lb 6.4 oz (88.6 kg)   SpO2 99%   BMI 31.54 kg/m??     1. Have you been to the ER, urgent care clinic since your last visit?  Hospitalized since your last visit?No    2. Have you seen or consulted any other health care providers outside of the [facility] since your last visit?  Include any pap smears or colon screening. No    Reviewed record in preparation for visit and have necessary documentation  Pt did not bring medication to office visit for review  opportunity was given for questions  Goals that were addressed and/or need to be completed during or after this appointment include   There are no preventive care reminders to display for this patient.

## 2018-09-12 NOTE — Progress Notes (Signed)
[facility]    History of Present Illness:   Brandi Clark is a 43 y.o. female with history of Eczema, Anxiety, Low back pain  CC: Follow up chronic conditions  History provided by patient and Records    HPI:  Eczema/Atopic Dermatitis Follow up:  Patient is following up for eczema which is currently moderately well controlled with Jurgen's.  Patient generally has exacerbations several a year.  Exacerbations generally caused by Dry skin, hand sanitizer.  Associated conditions are well controlled at this time.     Low back Pain with Sciatica: Improved at this time.  Takes Ibuprofen as needed.  Occurred after accident involving Dog.      Hypokalemia: Patient still taking Potassium Supplement     Health Maintenance  There are no preventive care reminders to display for this patient.    Past Medical, Family, and Social History:     Current Outpatient Medications on File Prior to Visit   Medication Sig Dispense Refill   ??? potassium 99 mg tablet Take 99 mg by mouth daily. Indications: Pt takes 0.5 tab daily     ??? ibuprofen (MOTRIN) 800 mg tablet Take 800 mg by mouth every four (4) hours.     ??? [DISCONTINUED] dihydroxyaluminum sodium carb (ANTACID RELIEF PO) Take 1 Tab by mouth daily. Indications: OTC (Equate brand)     ??? [DISCONTINUED] nicotine (NICODERM CQ) 21 mg/24 hr 1 Patch by TransDERmal route every twenty-four (24) hours.     ??? [DISCONTINUED] acetaminophen (TYLENOL ARTHRITIS PAIN) 650 mg TbER Take 650 mg by mouth every eight (8) hours.     ??? [DISCONTINUED] clobetasol (TEMOVATE) 0.05 % ointment Apply  to affected area two (2) times a day. To eczema on hands. (Patient not taking: Reported on 09/09/2017) 15 g 0   ??? [DISCONTINUED] phenylephrine hcl (SUPHEDRINE PE) 10 mg tab Take  by mouth every four (4) hours.       No current facility-administered medications on file prior to visit.        Patient Active Problem List   Diagnosis Code   ??? Anxiety F41.9    ??? Elevated blood pressure reading without diagnosis of hypertension R03.0   ??? Chronic pain of left knee M25.562, G89.29   ??? 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 Eczema L30.9       Social History     Socioeconomic History   ??? Marital status: DIVORCED     Spouse name: Not on file   ??? Number of children: Not on file   ??? Years of education: Not on file   ??? Highest education level: Not on file   Occupational History   ??? Not on file   Social Needs   ??? Financial resource strain: Not on file   ??? Food insecurity     Worry: Not on file     Inability: Not on file   ??? Transportation needs     Medical: Not on file     Non-medical: Not on file   Tobacco Use   ??? Smoking status: Former Smoker     Packs/day: 1.00     Types: Cigarettes     Last attempt to quit: 08/18/2017     Years since quitting: 1.0   ??? Smokeless tobacco: Never Used   Substance and Sexual Activity   ??? Alcohol use: No   ??? Drug use: No     Types: IV   ??? Sexual  activity: Yes     Partners: Male   Lifestyle   ??? Physical activity     Days per week: Not on file     Minutes per session: Not on file   ??? Stress: Not on file   Relationships   ??? Social Wellsite geologist on phone: Not on file     Gets together: Not on file     Attends religious service: Not on file     Active member of club or organization: Not on file     Attends meetings of clubs or organizations: Not on file     Relationship status: Not on file   ??? Intimate partner violence     Fear of current or ex partner: Not on file     Emotionally abused: Not on file     Physically abused: Not on file     Forced sexual activity: Not on file   Other Topics Concern   ??? Not on file   Social History Narrative    Breast Cancer Risk Factors update 08/14/16    # of Children:  G3 P2013    Age at Delivery of First Child: 24.      Hysterectomy/oophorectomy:  No/No.      Breast Bx: No.      Hx of Breast Feeding:  no.      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     Hormone therapy: No.      FamHx of breast ca in first degree relative: No       Review of Systems   Review of Systems   Constitutional: Negative for chills and fever.   Cardiovascular: Negative for chest pain and palpitations.   Gastrointestinal: Negative for abdominal pain, nausea and vomiting.   Skin: Positive for itching and rash.       Objective:     Visit Vitals  BP 144/86 (BP 1 Location: Left arm, BP Patient Position: Sitting)   Pulse 72   Temp 97.3 ??F (36.3 ??C) (Oral)   Resp 18   Ht 5\' 6"  (1.676 m)   Wt 195 lb 6.4 oz (88.6 kg)   SpO2 99%   BMI 31.54 kg/m??        Physical Exam  Vitals signs and nursing note reviewed.   Constitutional:       Appearance: Normal appearance.   HENT:      Head: Normocephalic and atraumatic.   Neck:      Musculoskeletal: Normal range of motion and neck supple.   Cardiovascular:      Rate and Rhythm: Normal rate and regular rhythm.      Pulses: Normal pulses.      Heart sounds: Normal heart sounds. No murmur. No friction rub. No gallop.    Pulmonary:      Effort: Pulmonary effort is normal.      Breath sounds: Normal breath sounds.   Abdominal:      General: Abdomen is flat. Bowel sounds are normal.      Palpations: Abdomen is soft.   Skin:     Comments: Area of itching, Erythema, and pruritis on the left hand   Neurological:      Mental Status: She is alert.       Pertinent Labs/Studies:      Assessment and orders:       ICD-10-CM ICD-9-CM    1. Other eczema L30.8 692.9 triamcinolone acetonide (KENALOG) 0.025 % topical cream  2. Chronic bilateral low back pain with left-sided sciatica M54.42 724.2     G89.29 724.3      338.29    3. Hypokalemia E87.6 276.8    4. Elevated blood pressure reading without diagnosis of hypertension R03.0 796.2      Diagnoses and all orders for this visit:    1. Other eczema: Starting treatment with Kenalog cream.  Continue Moisturizer.    -     triamcinolone acetonide (KENALOG) 0.025 % topical cream; Apply  to affected area two (2) times a day. use thin layer     2. Chronic bilateral low back pain with left-sided sciatica: Condition and symptoms are well controlled at this time.  No changes to therapy at this time.     3. Hypokalemia: Continue Potassium    4. Elevated blood pressure reading without diagnosis of hypertension: Continue monitoring.  On follow up if elevated will start treatment.      Follow-up and Dispositions    ?? Return in about 6 months (around 03/15/2019) for Chronic Care.           I have discussed the diagnosis with the patient and the intended plan as seen in the above orders.  Social history, medical history, and labs were reviewed.  The patient has received an after-visit summary and questions were answered concerning future plans.  I have discussed medication side effects and warnings with the patient as well.    Altamese Cabal, MD  [facility]  09/12/18

## 2019-02-03 ENCOUNTER — Telehealth: Admit: 2019-02-03 | Discharge: 2019-02-03 | Payer: MEDICAID | Attending: Family Medicine | Primary: Family Medicine

## 2019-02-03 ENCOUNTER — Telehealth: Attending: Family Medicine | Primary: Family Medicine

## 2019-02-03 DIAGNOSIS — F419 Anxiety disorder, unspecified: Secondary | ICD-10-CM

## 2019-02-03 MED ORDER — HYDROXYZINE 50 MG TAB
50 mg | ORAL_TABLET | Freq: Four times a day (QID) | ORAL | 1 refills | Status: DC | PRN
Start: 2019-02-03 — End: 2019-04-09

## 2019-02-03 MED ORDER — SERTRALINE 50 MG TAB
50 mg | ORAL_TABLET | ORAL | 1 refills | Status: DC
Start: 2019-02-03 — End: 2019-04-09

## 2019-02-03 NOTE — Progress Notes (Signed)
 Chief Complaint   Patient presents with   . Hospital Follow Up     SCH-ER, anxiety     1. Have you been to the ER, urgent care clinic since your last visit?  Hospitalized since your last visit?Yes SCH-ER    2. Have you seen or consulted any other health care providers outside of the [facility] since your last visit?  Include any pap smears or colon screening. No    Reviewed record in preparation for visit and have necessary documentation  Pt did not bring medication to office visit for review  opportunity was given for questions  Goals that were addressed and/or need to be completed during or after this appointment include   There are no preventive care reminders to display for this patient.

## 2019-02-03 NOTE — Progress Notes (Signed)
Brandi Clark is a 43 y.o. female evaluated via Doximetry on 02/03/19.  Patient Identity confirmed by DOB.    Consent:  He and/or health care decision maker is aware that that he may receive a bill for this telephone service, depending on his insurance coverage, and has provided verbal consent to proceed: Yes    Physician Location: Office  Patient Location: Home  Nurse Assisting with Encounter: Wyline Beadyorinne Agnew, LPN    Chief Complaint   Patient presents with   ??? Hospital Follow Up     SCH-ER, anxiety      Information gathered from patient and/or health care decision maker.    HPI:   Anxiety and Depression Initial Evaluation:  Patient presents with known history of anxiety that was diagnosed 20 year(s).  Patient reports that symptoms are currently poorly controlled overall and that symptoms are are worsening.  Patient does not endorse suicidal/homicidal thoughts/plans.  Patient does not endorse self harm/injury.  Primary issue is Stress and danger getting out of control.  Noting any possible trigger, and can occur any poiunt of the day.  Previously controlled with breathing exercises.  - Primary current symptoms: Fatigue, decreased sleep, panic attacks, poor concentration/attention, racing thoughts and restlessness, anxiety, mood swings and sleep disturbance.   - Associated symptoms: Chest pains, sweats, angry  - Length of current Episode: 1 month(s)  - Effects on Patients Life: Needed to go to ER, family has noticed irritability  - Current Medications: None   - Current Therapies: None  - Current Psychiatrist/Counselor: None  - Current social stressors: family, good support system    Previous Mental Health History:  - Previous Diagnoses include anxiety disorder  - Previous Symptoms:  Fatigue, decreased sleep, panic attacks, poor concentration/attention, racing thoughts and restlessness, anxiety, mood swings and sleep disturbance.   - Pertinent history related to condition: Relationship  - History of suicidal  Ideation/Attempts: No   - Previous medications/therapies: None.  Why failed: N/A  - Previous Pharmacist, hospitalsychiatrist Counselors: [place]  - Previous Psychiatric admissions: None  - Family Psychiatric history: anxiety  - Past Social History Include: family and marital  - Past social Stressors: family and marital    Contributing factors to Depression/Anxiety:  - Medications/Drugs Include: None  - Organic Causes Include: None  - Past Medical Conditions Include: None    Review of Systems   Constitutional: Negative for chills and fever.   Cardiovascular: Negative for chest pain and palpitations.   Psychiatric/Behavioral: Negative for depression and suicidal ideas. The patient is nervous/anxious.           Limited Exam:  Due to this being a TeleHealth evaluation, many elements of the physical examination are unable to be assessed.      Constitutional: Appears well-developed and well-nourished in no apparent distress   Mental status: Alert and awake, Oriented to person/place/time, Able to follow commands  Eyes: EOM normal, Sclera normal, No visible ocular discharge  HENT: Normocephalic, atraumatic; Mouth/Throat: Moist mucous membranes, External Ears normal  Neck: No visualized mass  Pulmonary/Chest: Respiratory effort normal, No visualized signs of difficulty breathing or respiratory distress   Musculoskeletal: Normal gait with no signs of ataxia, Normal range of motion of neck  Neurological: No facial asymmetry (Cranial nerve 7 motor function), No gaze palsy  Skin: No significant exanthematous lesions or discoloration noted on facial skin  Psychiatric: Normal affect, normal judgment/insight. No hallucinations     Anxiety Assessment: GAD-7  "over the last 2 weeks how often have you had or been" (  Answers:none=0, several days a week=1, more than half of the days=2, nearly everyday=3)     Feeling nervous anxious, or on edge? 3  Not being able to stop or control worrying? 3  Worrying too much about different things? 3  Trouble  relaxing? 3  Being so restless unable to sit still? 2  Easily annoyed or irritable? 3  Feeling afraid as if something awful will happen? 1    Total: 18      Current Outpatient Medications on File Prior to Visit   Medication Sig Dispense Refill   ??? potassium 99 mg tablet Take 99 mg by mouth daily. Indications: Pt takes 0.5 tab daily     ??? ibuprofen (MOTRIN) 800 mg tablet Take 800 mg by mouth as needed.     ??? triamcinolone acetonide (KENALOG) 0.025 % topical cream Apply  to affected area two (2) times a day. use thin layer 15 g 0     No current facility-administered medications on file prior to visit.         Allergies   Allergen Reactions   ??? Codeine Rash and Nausea Only     Other reaction(s): GI intolerance        Patient Active Problem List    Diagnosis Date Noted   ??? Eczema    ??? Chronic pain of left knee 12/29/2016   ??? 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 Anxiety 07/31/2016   ??? Elevated blood pressure reading without diagnosis of hypertension 07/31/2016        There are no preventive care reminders to display for this patient.     Assessment/Plan:  Details of this discussion including any medical advice provided: Trial of Zoloft for Anxiety/Panic attacks.  Also staring Hydroxyzine for as needed use.    ICD-10-CM ICD-9-CM    1. Anxiety  F41.9 300.00 sertraline (ZOLOFT) 50 mg tablet      hydrOXYzine HCL (ATARAX) 50 mg tablet     Follow-up and Dispositions    ?? Return in about 2 weeks (around 02/17/2019) for Follow up Virtual Visit for Depression.           Total Time: minutes: 11-20 minutes was spent on telemedicine encounter discussing above problems and plans.  Patient Problem list, medications, and Allergies were reviewed during this encounter.    Pursuant to the emergency declaration under the [place], 1135 waiver authority and the [employer] Act, this Telephone Visit was conducted, with  patient's consent, to reduce the patient's risk of exposure to COVID-19 and provide continuity of care for an established patient.     I affirm this is a Patient Initiated Episode with an Established Patient who has not had a related appointment within my department in the past 7 days or scheduled within the next 24 hours.    Discussed diagnoses in detail with patient. Medication risks/benefits/side effects discussed with patient. All of the patient's questions were addressed. The patient understands and agrees with our plan of care.  The patient knows to call back if they are unsure of or forget any changes we discussed today or if the symptoms change.    Note: not billable if this call serves to triage the patient into an appointment for the relevant concern    Vernia Buff, MD  [facility]  02/03/19

## 2019-02-03 NOTE — Progress Notes (Signed)
Brandi Clark is a 43 y.o. female evaluated via Doximetry on 02/03/19.  Patient Identity confirmed by DOB.    Consent:  He and/or health care decision maker is aware that that he may receive a bill for this telephone service, depending on his insurance coverage, and has provided verbal consent to proceed: Yes    Physician Location: Office  Patient Location: Home  Nurse Assisting with Encounter: Vanessa Kick, LPN    Chief Complaint   Patient presents with   ??? Hospital Follow Up     [facility], anxiety      Information gathered from patient and/or health care decision maker.    HPI:   Anxiety and Depression Initial Evaluation:  Patient presents with known history of anxiety that was diagnosed 20 year(s).  Patient reports that symptoms are currently poorly controlled overall and that symptoms are are worsening.  Patient does not endorse suicidal/homicidal thoughts/plans.  Patient does not endorse self harm/injury.  Primary issue is Stress and danger getting out of control.  Noting any possible trigger, and can occur any poiunt of the day.  Previously controlled with breathing exercises.  - Primary current symptoms: Fatigue, decreased sleep, panic attacks, poor concentration/attention, racing thoughts and restlessness, anxiety, mood swings and sleep disturbance.   - Associated symptoms: Chest pains, sweats, angry  - Length of current Episode: 1 month(s)  - Effects on Patients Life: Needed to go to ER, family has noticed irritability  - Current Medications: None   - Current Therapies: None  - Current Psychiatrist/Counselor: None  - Current social stressors: family, good support system    Previous Mental Health History:  - Previous Diagnoses include anxiety disorder  - Previous Symptoms:  Fatigue, decreased sleep, panic attacks, poor concentration/attention, racing thoughts and restlessness, anxiety, mood swings and sleep disturbance.   - Pertinent history related to condition: Relationship   - History of suicidal Ideation/Attempts: No   - Previous medications/therapies: None.  Why failed: N/A  - Previous Building services engineer: Therapist, occupational  - Previous Psychiatric admissions: None  - Family Psychiatric history: anxiety  - Past Social History Include: family and marital  - Past social Stressors: family and marital    Contributing factors to Depression/Anxiety:  - Medications/Drugs Include: None  - Organic Causes Include: None  - Past Medical Conditions Include: None    Review of Systems   Constitutional: Negative for chills and fever.   Cardiovascular: Negative for chest pain and palpitations.   Psychiatric/Behavioral: Negative for depression and suicidal ideas. The patient is nervous/anxious.           Limited Exam:  Due to this being a TeleHealth evaluation, many elements of the physical examination are unable to be assessed.      Constitutional: Appears well-developed and well-nourished in no apparent distress   Mental status: Alert and awake, Oriented to person/place/time, Able to follow commands  Eyes: EOM normal, Sclera normal, No visible ocular discharge  HENT: Normocephalic, atraumatic; Mouth/Throat: Moist mucous membranes, External Ears normal  Neck: No visualized mass  Pulmonary/Chest: Respiratory effort normal, No visualized signs of difficulty breathing or respiratory distress   Musculoskeletal: Normal gait with no signs of ataxia, Normal range of motion of neck  Neurological: No facial asymmetry (Cranial nerve 7 motor function), No gaze palsy  Skin: No significant exanthematous lesions or discoloration noted on facial skin  Psychiatric: Normal affect, normal judgment/insight. No hallucinations     Anxiety Assessment: GAD-7  "over the last 2 weeks how often have you had or been" (  Answers:none=0, several days a week=1, more than half of the days=2, nearly everyday=3)     Feeling nervous anxious, or on edge? 3  Not being able to stop or control worrying? 3   Worrying too much about different things? 3  Trouble relaxing? 3  Being so restless unable to sit still? 2  Easily annoyed or irritable? 3  Feeling afraid as if something awful will happen? 1    Total: 18      Current Outpatient Medications on File Prior to Visit   Medication Sig Dispense Refill   ??? potassium 99 mg tablet Take 99 mg by mouth daily. Indications: Pt takes 0.5 tab daily     ??? ibuprofen (MOTRIN) 800 mg tablet Take 800 mg by mouth as needed.     ??? triamcinolone acetonide (KENALOG) 0.025 % topical cream Apply  to affected area two (2) times a day. use thin layer 15 g 0     No current facility-administered medications on file prior to visit.         Allergies   Allergen Reactions   ??? Codeine Rash and Nausea Only     Other reaction(s): GI intolerance        Patient Active Problem List    Diagnosis Date Noted   ??? Eczema    ??? Chronic pain of left knee 12/29/2016   ??? 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 Anxiety 07/31/2016   ??? Elevated blood pressure reading without diagnosis of hypertension 07/31/2016        There are no preventive care reminders to display for this patient.     Assessment/Plan:  Details of this discussion including any medical advice provided: Trial of Zoloft for Anxiety/Panic attacks.  Also staring Hydroxyzine for as needed use.    ICD-10-CM ICD-9-CM    1. Anxiety  F41.9 300.00 sertraline (ZOLOFT) 50 mg tablet      hydrOXYzine HCL (ATARAX) 50 mg tablet     Follow-up and Dispositions    ?? Return in about 2 weeks (around 02/17/2019) for Follow up Virtual Visit for Depression.           Total Time: minutes: 11-20 minutes was spent on telemedicine encounter discussing above problems and plans.  Patient Problem list, medications, and Allergies were reviewed during this encounter.    Pursuant to the emergency declaration under the [employer] [place] Act and the [employer] Emergen[employer] Act, 1135 waiver authority and the [employer]  [employer]ared[employer] and [employer] Supplemental Appropriations Act, this Telephone Visit was conducted, with patient's consent, to reduce the patient's risk of exposure to COVID-19 and provide continuity of care for an established patient.     I affirm this is a Patient Initiated Episode with an Established Patient who has not had a related appointment within my department in the past 7 days or scheduled within the next 24 hours.    Discussed diagnoses in detail with patient. Medication risks/benefits/side effects discussed with patient. All of the patient's questions were addressed. The patient understands and agrees with our plan of care.  The patient knows to call back if they are unsure of or forget any changes we discussed today or if the symptoms change.    Note: not billable if this call serves to triage the patient into an appointment for the relevant concern    Altamese CabalWilliam B Bernardina Cacho, MD  [facility]  02/03/19

## 2019-02-03 NOTE — Progress Notes (Signed)
Chief Complaint   Patient presents with   ??? Hospital Follow Up     [facility], anxiety     1. Have you been to the ER, urgent care clinic since your last visit?  Hospitalized since your last visit?Yes [facility]    2. Have you seen or consulted any other health care providers outside of the [facility] since your last visit?  Include any pap smears or colon screening. No    Reviewed record in preparation for visit and have necessary documentation  Pt did not bring medication to office visit for review  opportunity was given for questions  Goals that were addressed and/or need to be completed during or after this appointment include   There are no preventive care reminders to display for this patient.

## 2019-03-08 ENCOUNTER — Telehealth: Admit: 2019-03-08 | Discharge: 2019-03-08 | Payer: MEDICAID | Attending: Family Medicine | Primary: Family Medicine

## 2019-03-08 ENCOUNTER — Telehealth: Attending: Family Medicine | Primary: Family Medicine

## 2019-03-08 DIAGNOSIS — R6889 Other general symptoms and signs: Secondary | ICD-10-CM

## 2019-03-08 NOTE — Progress Notes (Signed)
Brandi Clark is a 43 y.o. female evaluated via Telephone on 03/08/19.  Patient Identity confirmed by DOB.    Consent:  He and/or health care decision maker is aware that that he may receive a bill for this telephone service, depending on his insurance coverage, and has provided verbal consent to proceed: Yes    Physician Location: Office  Patient Location: Home  Nurse Assisting with Encounter: Elenor Quinones, LPN    Chief Complaint   Patient presents with   ??? Documentation     return to work note      Information gathered from patient and/or health care decision maker.    HPI:   Patient developed symptoms of chest "heaviness" with congestion and fatigue starting 8 days ago.  Noting some coughing with chills, no fevers.  Did have diarrhea.  Went to the ER on Friday and had COVID-19 testing which was negative.  Symptoms are improving, still has congestion, fatigue improving, diarrhea starting to improve.    Review of Systems   Constitutional: Positive for malaise/fatigue. Negative for chills and fever.   HENT: Positive for congestion.    Respiratory: Positive for cough.    Cardiovascular: Negative for chest pain.   Gastrointestinal: Positive for diarrhea.          Limited Exam:  Due to this being a TeleHealth evaluation, many elements of the physical examination are unable to be assessed.      Constitutional: Appears in no apparent distress   Mental status: Alert and awake, Oriented to person/place/time, Able to follow commands  Psychiatric: Normal affect, normal judgment/insight. No hallucinations     Current Outpatient Medications on File Prior to Visit   Medication Sig Dispense Refill   ??? sertraline (ZOLOFT) 50 mg tablet Take 1 tablet daily for one week then increase to 2 tablets 60 Tab 1   ??? hydrOXYzine HCL (ATARAX) 50 mg tablet Take 1 Tab by mouth every six (6) hours as needed for Anxiety. 90 Tab 1   ??? potassium 99 mg tablet Take 99 mg by mouth daily. Indications: Pt takes 0.5 tab daily     ??? ibuprofen  (MOTRIN) 800 mg tablet Take 800 mg by mouth as needed.     ??? triamcinolone acetonide (KENALOG) 0.025 % topical cream Apply  to affected area two (2) times a day. use thin layer 15 g 0     No current facility-administered medications on file prior to visit.         Allergies   Allergen Reactions   ??? Codeine Rash and Nausea Only     Other reaction(s): GI intolerance        Patient Active Problem List    Diagnosis Date Noted   ??? Eczema    ??? Chronic pain of left knee 12/29/2016   ??? 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 Anxiety 07/31/2016   ??? Elevated blood pressure reading without diagnosis of hypertension 07/31/2016        Health Maintenance Due   Topic Date Due   ??? Flu Vaccine (1) 02/14/2019        Assessment/Plan:  Details of this discussion including any medical advice provided: COVID testing negative, but flu-like symptoms and still recovering.  Recommending continued self quarantine and symptom resolution before return to work.    ICD-10-CM ICD-9-CM    1. Flu-like symptoms  R68.89 780.99    2. COVID-19 virus antibody negative  Z78.9 V49.89  Follow-up and Dispositions    ?? Return if symptoms worsen or fail to improve, for VV.         Total Time: minutes: 5-10 minutes was spent on telemedicine encounter discussing above problems and plans.  Patient Problem list, medications, and Allergies were reviewed during this encounter.    Pursuant to the emergency declaration under the [facility] Act and the [employer], 1135 waiver authority and the [employer] and [employer] Act, this Telephone Visit was conducted, with patient's consent, to reduce the patient's risk of exposure to COVID-19 and provide continuity of care for an established patient.     I affirm this is a Patient Initiated Episode with an Established Patient who has not had a related appointment within my department in the past 7 days or scheduled within the next 24  hours.    Discussed diagnoses in detail with patient. Medication risks/benefits/side effects discussed with patient. All of the patient's questions were addressed. The patient understands and agrees with our plan of care.  The patient knows to call back if they are unsure of or forget any changes we discussed today or if the symptoms change.    Note: not billable if this call serves to triage the patient into an appointment for the relevant concern    Altamese Cabal, MD  [facility]  03/08/19

## 2019-03-08 NOTE — Progress Notes (Signed)
 Chief Complaint   Patient presents with   . Documentation     return to work note     1. Have you been to the ER, urgent care clinic since your last visit?  Hospitalized since your last visit?Yes [facility],     2. Have you seen or consulted any other health care providers outside of the [facility] since your last visit?  Include any pap smears or colon screening. No    Reviewed record in preparation for visit and have necessary documentation  Pt did not bring medication to office visit for review  opportunity was given for questions  Goals that were addressed and/or need to be completed during or after this appointment include   Health Maintenance Due   Topic Date Due   . Flu Vaccine (1) 02/14/2019

## 2019-03-08 NOTE — Progress Notes (Signed)
Chief Complaint   Patient presents with   ??? Documentation     return to work note     1. Have you been to the ER, urgent care clinic since your last visit?  Hospitalized since your last visit?Yes [facility],     2. Have you seen or consulted any other health care providers outside of the [facility] since your last visit?  Include any pap smears or colon screening. No    Reviewed record in preparation for visit and have necessary documentation  Pt did not bring medication to office visit for review  opportunity was given for questions  Goals that were addressed and/or need to be completed during or after this appointment include   Health Maintenance Due   Topic Date Due   ??? Flu Vaccine (1) 02/14/2019

## 2019-03-08 NOTE — Progress Notes (Signed)
Brandi Clark is a 43 y.o. female evaluated via Telephone on 03/08/19.  Patient Identity confirmed by DOB.    Consent:  He and/or health care decision maker is aware that that he may receive a bill for this telephone service, depending on his insurance coverage, and has provided verbal consent to proceed: Yes    Physician Location: Office  Patient Location: Home  Nurse Assisting with Encounter: Lyla Glassing, LPN    Chief Complaint   Patient presents with   ??? Documentation     return to work note      Information gathered from patient and/or health care decision maker.    HPI:   Patient developed symptoms of chest "heaviness" with congestion and fatigue starting 8 days ago.  Noting some coughing with chills, no fevers.  Did have diarrhea.  Went to the ER on Friday and had COVID-19 testing which was negative.  Symptoms are improving, still has congestion, fatigue improving, diarrhea starting to improve.    Review of Systems   Constitutional: Positive for malaise/fatigue. Negative for chills and fever.   HENT: Positive for congestion.    Respiratory: Positive for cough.    Cardiovascular: Negative for chest pain.   Gastrointestinal: Positive for diarrhea.          Limited Exam:  Due to this being a TeleHealth evaluation, many elements of the physical examination are unable to be assessed.      Constitutional: Appears in no apparent distress   Mental status: Alert and awake, Oriented to person/place/time, Able to follow commands  Psychiatric: Normal affect, normal judgment/insight. No hallucinations     Current Outpatient Medications on File Prior to Visit   Medication Sig Dispense Refill   ??? sertraline (ZOLOFT) 50 mg tablet Take 1 tablet daily for one week then increase to 2 tablets 60 Tab 1   ??? hydrOXYzine HCL (ATARAX) 50 mg tablet Take 1 Tab by mouth every six (6) hours as needed for Anxiety. 90 Tab 1   ??? potassium 99 mg tablet Take 99 mg by mouth daily. Indications: Pt takes 0.5 tab daily      ??? ibuprofen (MOTRIN) 800 mg tablet Take 800 mg by mouth as needed.     ??? triamcinolone acetonide (KENALOG) 0.025 % topical cream Apply  to affected area two (2) times a day. use thin layer 15 g 0     No current facility-administered medications on file prior to visit.         Allergies   Allergen Reactions   ??? Codeine Rash and Nausea Only     Other reaction(s): GI intolerance        Patient Active Problem List    Diagnosis Date Noted   ??? Eczema    ??? Chronic pain of left knee 12/29/2016   ??? 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 Anxiety 07/31/2016   ??? Elevated blood pressure reading without diagnosis of hypertension 07/31/2016        Health Maintenance Due   Topic Date Due   ??? Flu Vaccine (1) 02/14/2019        Assessment/Plan:  Details of this discussion including any medical advice provided: COVID testing negative, but flu-like symptoms and still recovering.  Recommending continued self quarantine and symptom resolution before return to work.    ICD-10-CM ICD-9-CM    1. Flu-like symptoms  R68.89 780.99    2. COVID-19 virus antibody negative  Z78.9 V49.89  Follow-up and Dispositions    ?? Return if symptoms worsen or fail to improve, for VV.         Total Time: minutes: 5-10 minutes was spent on telemedicine encounter discussing above problems and plans.  Patient Problem list, medications, and Allergies were reviewed during this encounter.    Pursuant to the emergency declaration under the [facility] Act and the [employer], 1135 waiver authority and the [employer] and [employer] Act, this Telephone Visit was conducted, with patient's consent, to reduce the patient's risk of exposure to COVID-19 and provide continuity of care for an established patient.     I affirm this is a Patient Initiated Episode with an Established Patient who has not had a related appointment within my department in the past 7  days or scheduled within the next 24 hours.    Discussed diagnoses in detail with patient. Medication risks/benefits/side effects discussed with patient. All of the patient's questions were addressed. The patient understands and agrees with our plan of care.  The patient knows to call back if they are unsure of or forget any changes we discussed today or if the symptoms change.    Note: not billable if this call serves to triage the patient into an appointment for the relevant concern    Altamese Cabal, MD  [facility]  03/08/19

## 2019-03-15 ENCOUNTER — Ambulatory Visit: Payer: MEDICAID | Attending: Family Medicine | Primary: Family Medicine

## 2019-04-08 ENCOUNTER — Encounter

## 2019-04-09 MED ORDER — HYDROXYZINE 50 MG TAB
50 mg | ORAL_TABLET | ORAL | 2 refills | Status: DC
Start: 2019-04-09 — End: 2019-08-10

## 2019-04-09 MED ORDER — SERTRALINE 100 MG TAB
100 mg | ORAL_TABLET | Freq: Every day | ORAL | 1 refills | Status: DC
Start: 2019-04-09 — End: 2019-09-14

## 2019-06-07 ENCOUNTER — Telehealth: Admit: 2019-06-07 | Discharge: 2019-06-07 | Payer: MEDICAID | Attending: Family Medicine | Primary: Family Medicine

## 2019-06-07 ENCOUNTER — Telehealth: Attending: Family Medicine | Primary: Family Medicine

## 2019-06-07 DIAGNOSIS — R058 Other specified cough: Secondary | ICD-10-CM

## 2019-06-07 DIAGNOSIS — R059 Cough, unspecified: Secondary | ICD-10-CM

## 2019-06-07 MED ORDER — AMOXICILLIN CLAVULANATE 875 MG-125 MG TAB
875-125 mg | ORAL_TABLET | Freq: Two times a day (BID) | ORAL | 0 refills | Status: AC
Start: 2019-06-07 — End: 2019-06-17

## 2019-06-07 MED ORDER — FLUTICASONE 50 MCG/ACTUATION NASAL SPRAY, SUSP
50 mcg/actuation | Freq: Every day | NASAL | 1 refills | Status: DC
Start: 2019-06-07 — End: 2020-08-07

## 2019-06-07 NOTE — Progress Notes (Signed)
 Chief Complaint   Patient presents with   . Cold Symptoms     1. Have you been to the ER, urgent care clinic since your last visit?  Hospitalized since your last visit?No    2. Have you seen or consulted any other health care providers outside of the [facility] since your last visit?  Include any pap smears or colon screening. No    Reviewed record in preparation for visit and have necessary documentation  Pt did not bring medication to office visit for review  opportunity was given for questions  Goals that were addressed and/or need to be completed during or after this appointment include   Health Maintenance Due   Topic Date Due   . Flu Vaccine (1) 02/14/2019   . PAP AKA CERVICAL CYTOLOGY  08/15/2019

## 2019-06-07 NOTE — Progress Notes (Signed)
Brandi Clark is a 43 y.o. female evaluated via Doximetry on 06/07/19.  Patient Identity confirmed by DOB.    Consent:  He and/or health care decision maker is aware that that he may receive a bill for this telephone service, depending on his insurance coverage, and has provided verbal consent to proceed: Yes    Physician Location: Office  Patient Location: Home  Nurse Assisting with Encounter: Vanessa Kick, LPN    Chief Complaint   Patient presents with   ??? Cold Symptoms      Information gathered from patient and/or health care decision maker.    HPI:   Patient has had 1 week of worsening sinus congestion, cough, non-productive cough, chest congestion, Nausea and diarrhea, and chills. Patient did have contact with someone with now diagnosed Coronavirus.  Has had COVID test already was negative.  Notes particular frontal sinus pain/pressure.  Patient has been using netti-pot.      Review of Systems   Constitutional: Positive for chills and malaise/fatigue. Negative for fever.   HENT: Positive for congestion.    Respiratory: Positive for cough.    Gastrointestinal: Negative for abdominal pain, diarrhea, nausea and vomiting.   Neurological: Negative for headaches.        Limited Exam:  Due to this being a TeleHealth evaluation, many elements of the physical examination are unable to be assessed.      Constitutional: Appears well-developed and well-nourished in no apparent distress   Mental status: Alert and awake, Oriented to person/place/time, Able to follow commands  Eyes: EOM normal, Sclera normal, No visible ocular discharge  HENT: Normocephalic, atraumatic; Mouth/Throat: Moist mucous membranes, External Ears normal, sounds sinussy  Neck: No visualized mass  Pulmonary/Chest: Respiratory effort normal, No visualized signs of difficulty breathing or respiratory distress   Musculoskeletal: Normal gait with no signs of ataxia, Normal range of motion of neck  Neurological: No facial asymmetry (Cranial nerve 7 motor  function), No gaze palsy  Skin: No significant exanthematous lesions or discoloration noted on facial skin  Psychiatric: Normal affect, normal judgment/insight. No hallucinations     Current Outpatient Medications on File Prior to Visit   Medication Sig Dispense Refill   ??? sertraline (ZOLOFT) 100 mg tablet Take 1 Tab by mouth daily. 90 Tab 1   ??? hydrOXYzine HCL (ATARAX) 50 mg tablet TAKE 1 TABLET BY MOUTH EVERY 6 HOURS AS NEEDED FOR ANXIETY 90 Tab 2   ??? potassium 99 mg tablet Take 99 mg by mouth daily. Indications: Pt takes 0.5 tab daily     ??? triamcinolone acetonide (KENALOG) 0.025 % topical cream Apply  to affected area two (2) times a day. use thin layer 15 g 0   ??? ibuprofen (MOTRIN) 800 mg tablet Take 800 mg by mouth as needed.       No current facility-administered medications on file prior to visit.         Allergies   Allergen Reactions   ??? Codeine Rash and Nausea Only     Other reaction(s): GI intolerance        Patient Active Problem List    Diagnosis Date Noted   ??? Eczema    ??? Chronic pain of left knee 12/29/2016   ??? 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 Anxiety 07/31/2016   ??? Elevated blood pressure reading without diagnosis of hypertension 07/31/2016        Health Maintenance Due   Topic Date Due   ??? Flu  Vaccine (1) 02/14/2019   ??? PAP AKA CERVICAL CYTOLOGY  08/15/2019        Assessment/Plan:  Details of this discussion including any medical advice provided: Will get repeat testing given persistent symptoms, also though trial of Antibiotics given symptoms of Sinusitis, and course similar to Bacterial.    ICD-10-CM ICD-9-CM    1. Cough with exposure to COVID-19 virus  R05 786.2 NOVEL CORONAVIRUS (COVID-19)    Z20.828 V01.79    2. Acute non-recurrent frontal sinusitis  J01.10 461.1 amoxicillin-clavulanate (AUGMENTIN) 875-125 mg per tablet      fluticasone propionate (FLONASE) 50 mcg/actuation nasal spray      NOVEL CORONAVIRUS (COVID-19)   3. Flu-like symptoms  R68.89  780.99      Follow-up and Dispositions    ?? Return if symptoms worsen or fail to improve.           Total Time: minutes: 11-20 minutes was spent on telemedicine encounter discussing above problems and plans.  Patient Problem list, medications, and Allergies were reviewed during this encounter.    Pursuant to the emergency declaration under the [facility] Act and the [employer], 1135 waiver authority and the [employer] Act, this Telephone Visit was conducted, with patient's consent, to reduce the patient's risk of exposure to COVID-19 and provide continuity of care for an established patient.     I affirm this is a Patient Initiated Episode with an Established Patient who has not had a related appointment within my department in the past 7 days or scheduled within the next 24 hours.    Discussed diagnoses in detail with patient. Medication risks/benefits/side effects discussed with patient. All of the patient's questions were addressed. The patient understands and agrees with our plan of care.  The patient knows to call back if they are unsure of or forget any changes we discussed today or if the symptoms change.    Note: not billable if this call serves to triage the patient into an appointment for the relevant concern    Altamese Cabal, MD  [facility]  06/07/19

## 2019-06-07 NOTE — Progress Notes (Signed)
Chief Complaint   Patient presents with   ??? Cold Symptoms     1. Have you been to the ER, urgent care clinic since your last visit?  Hospitalized since your last visit?No    2. Have you seen or consulted any other health care providers outside of the [facility] since your last visit?  Include any pap smears or colon screening. No    Reviewed record in preparation for visit and have necessary documentation  Pt did not bring medication to office visit for review  opportunity was given for questions  Goals that were addressed and/or need to be completed during or after this appointment include   Health Maintenance Due   Topic Date Due   ??? Flu Vaccine (1) 02/14/2019   ??? PAP AKA CERVICAL CYTOLOGY  08/15/2019

## 2019-06-07 NOTE — Progress Notes (Signed)
Brandi Clark is a 43 y.o. female evaluated via Doximetry on 06/07/19.  Patient Identity confirmed by DOB.    Consent:  He and/or health care decision maker is aware that that he may receive a bill for this telephone service, depending on his insurance coverage, and has provided verbal consent to proceed: Yes    Physician Location: Office  Patient Location: Home  Nurse Assisting with Encounter: Vanessa Kick, LPN    Chief Complaint   Patient presents with   ??? Cold Symptoms      Information gathered from patient and/or health care decision maker.    HPI:   Patient has had 1 week of worsening sinus congestion, cough, non-productive cough, chest congestion, Nausea and diarrhea, and chills. Patient did have contact with someone with now diagnosed Coronavirus.  Has had COVID test already was negative.  Notes particular frontal sinus pain/pressure.  Patient has been using netti-pot.      Review of Systems   Constitutional: Positive for chills and malaise/fatigue. Negative for fever.   HENT: Positive for congestion.    Respiratory: Positive for cough.    Gastrointestinal: Negative for abdominal pain, diarrhea, nausea and vomiting.   Neurological: Negative for headaches.        Limited Exam:  Due to this being a TeleHealth evaluation, many elements of the physical examination are unable to be assessed.      Constitutional: Appears well-developed and well-nourished in no apparent distress   Mental status: Alert and awake, Oriented to person/place/time, Able to follow commands  Eyes: EOM normal, Sclera normal, No visible ocular discharge  HENT: Normocephalic, atraumatic; Mouth/Throat: Moist mucous membranes, External Ears normal, sounds sinussy  Neck: No visualized mass  Pulmonary/Chest: Respiratory effort normal, No visualized signs of difficulty breathing or respiratory distress   Musculoskeletal: Normal gait with no signs of ataxia, Normal range of motion of neck   Neurological: No facial asymmetry (Cranial nerve 7 motor function), No gaze palsy  Skin: No significant exanthematous lesions or discoloration noted on facial skin  Psychiatric: Normal affect, normal judgment/insight. No hallucinations     Current Outpatient Medications on File Prior to Visit   Medication Sig Dispense Refill   ??? sertraline (ZOLOFT) 100 mg tablet Take 1 Tab by mouth daily. 90 Tab 1   ??? hydrOXYzine HCL (ATARAX) 50 mg tablet TAKE 1 TABLET BY MOUTH EVERY 6 HOURS AS NEEDED FOR ANXIETY 90 Tab 2   ??? potassium 99 mg tablet Take 99 mg by mouth daily. Indications: Pt takes 0.5 tab daily     ??? triamcinolone acetonide (KENALOG) 0.025 % topical cream Apply  to affected area two (2) times a day. use thin layer 15 g 0   ??? ibuprofen (MOTRIN) 800 mg tablet Take 800 mg by mouth as needed.       No current facility-administered medications on file prior to visit.         Allergies   Allergen Reactions   ??? Codeine Rash and Nausea Only     Other reaction(s): GI intolerance        Patient Active Problem List    Diagnosis Date Noted   ??? Eczema    ??? Chronic pain of left knee 12/29/2016   ??? 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 Anxiety 07/31/2016   ??? Elevated blood pressure reading without diagnosis of hypertension 07/31/2016        Health Maintenance Due   Topic Date Due   ??? Flu  Vaccine (1) 02/14/2019   ??? PAP AKA CERVICAL CYTOLOGY  08/15/2019        Assessment/Plan:  Details of this discussion including any medical advice provided: Will get repeat testing given persistent symptoms, also though trial of Antibiotics given symptoms of Sinusitis, and course similar to Bacterial.    ICD-10-CM ICD-9-CM    1. Cough with exposure to COVID-19 virus  R05 786.2 NOVEL CORONAVIRUS (COVID-19)    Z20.828 V01.79    2. Acute non-recurrent frontal sinusitis  J01.10 461.1 amoxicillin-clavulanate (AUGMENTIN) 875-125 mg per tablet      fluticasone propionate (FLONASE) 50 mcg/actuation nasal spray       NOVEL CORONAVIRUS (COVID-19)   3. Flu-like symptoms  R68.89 780.99      Follow-up and Dispositions    ?? Return if symptoms worsen or fail to improve.           Total Time: minutes: 11-20 minutes was spent on telemedicine encounter discussing above problems and plans.  Patient Problem list, medications, and Allergies were reviewed during this encounter.    Pursuant to the emergency declaration under the [facility] Act and the [employer], 1135 waiver authority and the [employer] Act, this Telephone Visit was conducted, with patient's consent, to reduce the patient's risk of exposure to COVID-19 and provide continuity of care for an established patient.     I affirm this is a Patient Initiated Episode with an Established Patient who has not had a related appointment within my department in the past 7 days or scheduled within the next 24 hours.    Discussed diagnoses in detail with patient. Medication risks/benefits/side effects discussed with patient. All of the patient's questions were addressed. The patient understands and agrees with our plan of care.  The patient knows to call back if they are unsure of or forget any changes we discussed today or if the symptoms change.    Note: not billable if this call serves to triage the patient into an appointment for the relevant concern    Altamese Cabal, MD  [facility]  06/07/19

## 2019-06-09 LAB — COVID-19: SARS-CoV-2, NAA: NOT DETECTED

## 2019-06-09 LAB — NOVEL CORONAVIRUS (COVID-19): SARS-CoV-2, NAA: NOT DETECTED

## 2019-06-12 NOTE — Telephone Encounter (Signed)
-----   Message from Michele Mcalpine sent at 06/12/2019  4:00 PM EST -----  Regarding: Dr. Jackquline Bosch  General Message/Vendor Calls    Caller's first and last name: Pt      Reason for call: regarding forms      Callback required yes/no and why: yes, to provide pt with updated information       Best contact number(s): [phone]      Details to clarify the request: Pt calling to make sure Dr. Jearl Klinefelter received forms that pt is requesting to be completed. Please advise.      Michele Mcalpine

## 2019-06-12 NOTE — Telephone Encounter (Signed)
Pt called to see about her returning to work. Please call Pt. Thanks

## 2019-06-12 NOTE — Telephone Encounter (Signed)
-----   Message from Amanda Adams sent at 06/12/2019  4:00 PM EST -----  Regarding: Dr. Eggleston/Telephone  General Message/Vendor Calls    Caller's first and last name: Pt      Reason for call: regarding forms      Callback required yes/no and why: yes, to provide pt with updated information       Best contact number(s): [phone]      Details to clarify the request: Pt calling to make sure Dr. Eggleston received forms that pt is requesting to be completed. Please advise.      Amanda Adams

## 2019-06-13 ENCOUNTER — Encounter

## 2019-06-14 MED ORDER — PREDNISONE 20 MG TAB
20 mg | ORAL_TABLET | Freq: Every day | ORAL | 0 refills | Status: AC
Start: 2019-06-14 — End: 2019-06-19

## 2019-06-14 MED ORDER — IBUPROFEN 800 MG TAB
800 mg | ORAL_TABLET | Freq: Three times a day (TID) | ORAL | 1 refills | Status: DC | PRN
Start: 2019-06-14 — End: 2020-09-06

## 2019-06-14 MED ORDER — TIZANIDINE 4 MG TAB
4 mg | ORAL_TABLET | ORAL | 1 refills | Status: DC
Start: 2019-06-14 — End: 2020-04-16

## 2019-06-14 NOTE — Telephone Encounter (Signed)
From: Cathlean Sauer  To: Altamese Cabal, MD  Sent: 06/13/2019 10:05 AM EST  Subject: Prescription Question    Good morning.Marland Kitcheni went to the ER over my shoulder not to long ago. It's hurting me again. It was cervical paraspinous muscle spasms. They prescribed me robaxin which only helped me so long. If possible I need to help me. I have lidocaine patches as well. Thank you!

## 2019-08-10 ENCOUNTER — Encounter

## 2019-08-10 MED ORDER — HYDROXYZINE 50 MG TAB
50 mg | ORAL_TABLET | ORAL | 2 refills | Status: DC
Start: 2019-08-10 — End: 2019-11-30

## 2019-09-13 ENCOUNTER — Encounter

## 2019-09-14 MED ORDER — SERTRALINE 100 MG TAB
100 mg | ORAL_TABLET | ORAL | 1 refills | Status: DC
Start: 2019-09-14 — End: 2020-04-01

## 2019-11-09 ENCOUNTER — Ambulatory Visit: Admit: 2019-11-09 | Discharge: 2019-11-09 | Payer: MEDICAID | Attending: Family Medicine | Primary: Family Medicine

## 2019-11-09 ENCOUNTER — Ambulatory Visit: Attending: Family Medicine | Primary: Family Medicine

## 2019-11-09 DIAGNOSIS — M25551 Pain in right hip: Secondary | ICD-10-CM

## 2019-11-09 MED ORDER — LIDOCAINE 5 % (700 MG/PATCH) ADHESIVE PATCH
5 % | CUTANEOUS | 1 refills | Status: AC
Start: 2019-11-09 — End: ?

## 2019-11-09 MED ORDER — KETOROLAC TROMETHAMINE 10 MG TAB
10 mg | ORAL_TABLET | Freq: Four times a day (QID) | ORAL | 0 refills | Status: AC | PRN
Start: 2019-11-09 — End: 2019-11-14

## 2019-11-09 NOTE — Progress Notes (Signed)
[facility]    History of Present Illness:   Brandi Clark is a 44 y.o. female with history of Eczema, Anxiety, Low back pain  CC: Low Back Pain  History provided by patient and Records    HPI:  Low back Pain: Had a fall in the sower getting out, now Back pain is acting up.  Went to ER [facility]) and did XR of shoulder and thoracic spine.  Noting persistent pain in the right hip and the upper left arm.  Did get Toradol at the ER.  Also noting a lot of stress at home right now due to family situation.  Is taking Motrin and Flexeril right now.       Anxiety: Worsening due to multiple stressors.    Health Maintenance  Health Maintenance Due   Topic Date Due   ??? COVID-19 Vaccine (1) Never done   ??? PAP AKA CERVICAL CYTOLOGY  08/15/2019       Past Medical, Family, and Social History:     Current Outpatient Medications on File Prior to Visit   Medication Sig Dispense Refill   ??? sertraline (ZOLOFT) 100 mg tablet Take 1 tablet by mouth once daily 90 Tab 1   ??? hydrOXYzine HCL (ATARAX) 50 mg tablet TAKE 1 TABLET BY MOUTH EVERY 6 HOURS AS NEEDED FOR ANXIETY 90 Tab 2   ??? tiZANidine (ZANAFLEX) 4 mg tablet Take half or one full tablet up to 3 times daily as needed for pain.  Recommend start taking nightly as needed first. 30 Tab 1   ??? ibuprofen (MOTRIN) 800 mg tablet Take 1 Tab by mouth every eight (8) hours as needed for Pain. 90 Tab 1   ??? fluticasone propionate (FLONASE) 50 mcg/actuation nasal spray 2 Sprays by Both Nostrils route daily. 1 Bottle 1   ??? potassium 99 mg tablet Take 99 mg by mouth daily. Indications: Pt takes 0.5 tab daily     ??? triamcinolone acetonide (KENALOG) 0.025 % topical cream Apply  to affected area two (2) times a day. use thin layer 15 g 0     No current facility-administered medications on file prior to visit.       Patient Active Problem List   Diagnosis Code   ??? Anxiety F41.9   ??? Elevated blood pressure reading without diagnosis of hypertension R03.0   ??? Chronic pain of left knee  M25.562, G89.29   ??? 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 Eczema L30.9       Social History     Socioeconomic History   ??? Marital status: DIVORCED     Spouse name: Not on file   ??? Number of children: Not on file   ??? Years of education: Not on file   ??? Highest education level: Not on file   Occupational History   ??? Not on file   Tobacco Use   ??? Smoking status: Former Smoker     Packs/day: 1.00     Types: Cigarettes     Quit date: 08/18/2017     Years since quitting: 2.2   ??? Smokeless tobacco: Never Used   Substance and Sexual Activity   ??? Alcohol use: No   ??? Drug use: No     Types: IV   ??? Sexual activity: Yes     Partners: Male   Other Topics Concern   ??? Not on file   Social History Narrative    Breast Cancer  Risk Factors update 08/14/16    # of Children:  G3 P2013    Age at Delivery of First Child: 24.      Hysterectomy/oophorectomy:  No/No.      Breast Bx: No.      Hx of Breast Feeding:  no.      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     Hormone therapy: No.     FamHx of breast ca in first degree relative: No     Social Determinants of Psychologist, prison and probation services Strain:    ??? Difficulty of Paying Living Expenses:    Food Insecurity:    ??? Worried About Programme researcher, broadcasting/film/video in the Last Year:    ??? Barista in the Last Year:    Transportation Needs:    ??? Freight forwarder (Medical):    ??? Lack of Transportation (Non-Medical):    Physical Activity:    ??? Days of Exercise per Week:    ??? Minutes of Exercise per Session:    Stress:    ??? Feeling of Stress :    Social Connections:    ??? Frequency of Communication with Friends and Family:    ??? Frequency of Social Gatherings with Friends and Family:    ??? Attends Religious Services:    ??? Database administrator or Organizations:    ??? Attends Engineer, structural:    ??? Marital Status:    Intimate Programme researcher, broadcasting/film/video Violence:    ??? Fear of Current or Ex-Partner:    ??? Emotionally Abused:    ??? Physically Abused:    ??? Sexually Abused:         Review of Systems   Review of Systems   Constitutional: Negative for chills and fever.   Cardiovascular: Negative for chest pain and palpitations.   Gastrointestinal: Negative for abdominal pain, nausea and vomiting.   Musculoskeletal: Positive for back pain.   Psychiatric/Behavioral: The patient is nervous/anxious.        Objective:     Visit Vitals  BP (!) 143/98 (BP 1 Location: Right arm, BP Patient Position: Sitting, BP Cuff Size: Adult)   Pulse 90   Temp 97.7 ??F (36.5 ??C) (Oral)   Resp 18   Ht 5\' 6"  (1.676 m)   Wt 213 lb (96.6 kg)   SpO2 97%   BMI 34.38 kg/m??        Physical Exam  Vitals and nursing note reviewed.   Constitutional:       Appearance: Normal appearance.   HENT:      Head: Normocephalic and atraumatic.   Cardiovascular:      Rate and Rhythm: Normal rate and regular rhythm.      Pulses: Normal pulses.      Heart sounds: Normal heart sounds. No murmur heard.   No friction rub. No gallop.    Pulmonary:      Effort: Pulmonary effort is normal.      Breath sounds: Normal breath sounds.   Abdominal:      General: Abdomen is flat. Bowel sounds are normal.      Palpations: Abdomen is soft.   Musculoskeletal:      Cervical back: Normal range of motion and neck supple.      Comments: Point tenderness on the left arm and the right hip.   Neurological:      Mental Status: She is alert.  Pertinent Labs/Studies:      Assessment and orders:       ICD-10-CM ICD-9-CM    1. Right hip pain  M25.551 719.45 ketorolac (TORADOL) 10 mg tablet      lidocaine (LIDODERM) 5 %   2. Left arm pain  M79.602 729.5 ketorolac (TORADOL) 10 mg tablet      lidocaine (LIDODERM) 5 %   3. Anxiety  F41.9 300.00      Diagnoses and all orders for this visit:    1. Right hip pain: Contusion present, trial of toradol and patches.  -     ketorolac (TORADOL) 10 mg tablet; Take 1 Tablet by mouth every six (6) hours as needed for Pain for up to 5 days.  -     lidocaine (LIDODERM) 5 %; Apply 1-2 patch to the affected area for 12 hours  a day and remove for 12 hours a day.  Apply to right hip and left arm    2. Left arm pain  -     ketorolac (TORADOL) 10 mg tablet; Take 1 Tablet by mouth every six (6) hours as needed for Pain for up to 5 days.  -     lidocaine (LIDODERM) 5 %; Apply 1-2 patch to the affected area for 12 hours a day and remove for 12 hours a day.  Apply to right hip and left arm    3. Anxiety: Discussed, does not want to change medications at this time.  Will monitor with pain treamtent.      Follow-up and Dispositions    ?? Return in about 4 weeks (around 12/07/2019) for Well woman and Pap with BP check.           I have discussed the diagnosis with the patient and the intended plan as seen in the above orders.  Social history, medical history, and labs were reviewed.  The patient has received an after-visit summary and questions were answered concerning future plans.  I have discussed medication side effects and warnings with the patient as well.    Vernia Buff, MD  [facility]  11/09/19

## 2019-11-09 NOTE — Progress Notes (Signed)
 1. Have you been to the ER, urgent care clinic since your last visit?  Hospitalized since your last visit?Yes When: [facility]  for fall at home in shower     2. Have you seen or consulted any other health care providers outside of the [facility] since your last visit?  Include any pap smears or colon screening. No    Reviewed record in preparation for visit and have necessary documentation  Goals that were addressed and/or need to be completed during or after this appointment include     Health Maintenance Due   Topic Date Due   . COVID-19 Vaccine (1) Never done   . PAP AKA CERVICAL CYTOLOGY  08/15/2019       Patient is accompanied by self I have received verbal consent from Brandi Clark to discuss any/all medical information while they are present in the room.

## 2019-11-09 NOTE — Progress Notes (Signed)
[facility]    History of Present Illness:   Brandi Clark is a 44 y.o. female with history of Eczema, Anxiety, Low back pain  CC: Low Back Pain  History provided by patient and Records    HPI:  Low back Pain: Had a fall in the sower getting out, now Back pain is acting up.  Went to ER [facility]) and did XR of shoulder and thoracic spine.  Noting persistent pain in the right hip and the upper left arm.  Did get Toradol at the ER.  Also noting a lot of stress at home right now due to family situation.  Is taking Motrin and Flexeril right now.       Anxiety: Worsening due to multiple stressors.    Health Maintenance  Health Maintenance Due   Topic Date Due   ??? COVID-19 Vaccine (1) Never done   ??? PAP AKA CERVICAL CYTOLOGY  08/15/2019       Past Medical, Family, and Social History:     Current Outpatient Medications on File Prior to Visit   Medication Sig Dispense Refill   ??? sertraline (ZOLOFT) 100 mg tablet Take 1 tablet by mouth once daily 90 Tab 1   ??? hydrOXYzine HCL (ATARAX) 50 mg tablet TAKE 1 TABLET BY MOUTH EVERY 6 HOURS AS NEEDED FOR ANXIETY 90 Tab 2   ??? tiZANidine (ZANAFLEX) 4 mg tablet Take half or one full tablet up to 3 times daily as needed for pain.  Recommend start taking nightly as needed first. 30 Tab 1   ??? ibuprofen (MOTRIN) 800 mg tablet Take 1 Tab by mouth every eight (8) hours as needed for Pain. 90 Tab 1   ??? fluticasone propionate (FLONASE) 50 mcg/actuation nasal spray 2 Sprays by Both Nostrils route daily. 1 Bottle 1   ??? potassium 99 mg tablet Take 99 mg by mouth daily. Indications: Pt takes 0.5 tab daily     ??? triamcinolone acetonide (KENALOG) 0.025 % topical cream Apply  to affected area two (2) times a day. use thin layer 15 g 0     No current facility-administered medications on file prior to visit.       Patient Active Problem List   Diagnosis Code   ??? Anxiety F41.9   ??? Elevated blood pressure reading without diagnosis of hypertension R03.0   ??? Chronic pain of left knee  M25.562, G89.29   ??? 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 Eczema L30.9       Social History     Socioeconomic History   ??? Marital status: DIVORCED     Spouse name: Not on file   ??? Number of children: Not on file   ??? Years of education: Not on file   ??? Highest education level: Not on file   Occupational History   ??? Not on file   Tobacco Use   ??? Smoking status: Former Smoker     Packs/day: 1.00     Types: Cigarettes     Quit date: 08/18/2017     Years since quitting: 2.2   ??? Smokeless tobacco: Never Used   Substance and Sexual Activity   ??? Alcohol use: No   ??? Drug use: No     Types: IV   ??? Sexual activity: Yes     Partners: Male   Other Topics Concern   ??? Not on file   Social History Narrative    Breast Cancer  Risk Factors update 08/14/16    # of Children:  G3 P2013    Age at Delivery of First Child: 24.      Hysterectomy/oophorectomy:  No/No.      Breast Bx: No.      Hx of Breast Feeding:  no.      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     Hormone therapy: No.     FamHx of breast ca in first degree relative: No     Social Determinants of Health     Financial Resource Strain:    ??? Difficulty of Paying Living Expenses:    Food Insecurity:    ??? Worried About Running Out of Food in the Last Year:    ??? Ran Out of Food in the Last Year:    Transportation Needs:    ??? Lack of Transportation (Medical):    ??? Lack of Transportation (Non-Medical):    Physical Activity:    ??? Days of Exercise per Week:    ??? Minutes of Exercise per Session:    Stress:    ??? Feeling of Stress :    Social Connections:    ??? Frequency of Communication with Friends and Family:    ??? Frequency of Social Gatherings with Friends and Family:    ??? Attends Religious Services:    ??? Active Member of Clubs or Organizations:    ??? Attends Club or Organization Meetings:    ??? Marital Status:    Intimate Partner Violence:    ??? Fear of Current or Ex-Partner:    ??? Emotionally Abused:    ??? Physically Abused:    ??? Sexually Abused:         Review of Systems   Review of Systems   Constitutional: Negative for chills and fever.   Cardiovascular: Negative for chest pain and palpitations.   Gastrointestinal: Negative for abdominal pain, nausea and vomiting.   Musculoskeletal: Positive for back pain.   Psychiatric/Behavioral: The patient is nervous/anxious.        Objective:     Visit Vitals  BP (!) 143/98 (BP 1 Location: Right arm, BP Patient Position: Sitting, BP Cuff Size: Adult)   Pulse 90   Temp 97.7 ??F (36.5 ??C) (Oral)   Resp 18   Ht 5' 6" (1.676 m)   Wt 213 lb (96.6 kg)   SpO2 97%   BMI 34.38 kg/m??        Physical Exam  Vitals and nursing note reviewed.   Constitutional:       Appearance: Normal appearance.   HENT:      Head: Normocephalic and atraumatic.   Cardiovascular:      Rate and Rhythm: Normal rate and regular rhythm.      Pulses: Normal pulses.      Heart sounds: Normal heart sounds. No murmur heard.   No friction rub. No gallop.    Pulmonary:      Effort: Pulmonary effort is normal.      Breath sounds: Normal breath sounds.   Abdominal:      General: Abdomen is flat. Bowel sounds are normal.      Palpations: Abdomen is soft.   Musculoskeletal:      Cervical back: Normal range of motion and neck supple.      Comments: Point tenderness on the left arm and the right hip.   Neurological:      Mental Status: She is alert.           Pertinent Labs/Studies:      Assessment and orders:       ICD-10-CM ICD-9-CM    1. Right hip pain  M25.551 719.45 ketorolac (TORADOL) 10 mg tablet      lidocaine (LIDODERM) 5 %   2. Left arm pain  M79.602 729.5 ketorolac (TORADOL) 10 mg tablet      lidocaine (LIDODERM) 5 %   3. Anxiety  F41.9 300.00      Diagnoses and all orders for this visit:    1. Right hip pain: Contusion present, trial of toradol and patches.  -     ketorolac (TORADOL) 10 mg tablet; Take 1 Tablet by mouth every six (6) hours as needed for Pain for up to 5 days.  -     lidocaine (LIDODERM) 5 %; Apply 1-2 patch to the affected area for 12 hours  a day and remove for 12 hours a day.  Apply to right hip and left arm    2. Left arm pain  -     ketorolac (TORADOL) 10 mg tablet; Take 1 Tablet by mouth every six (6) hours as needed for Pain for up to 5 days.  -     lidocaine (LIDODERM) 5 %; Apply 1-2 patch to the affected area for 12 hours a day and remove for 12 hours a day.  Apply to right hip and left arm    3. Anxiety: Discussed, does not want to change medications at this time.  Will monitor with pain treamtent.      Follow-up and Dispositions    ?? Return in about 4 weeks (around 12/07/2019) for Well woman and Pap with BP check.           I have discussed the diagnosis with the patient and the intended plan as seen in the above orders.  Social history, medical history, and labs were reviewed.  The patient has received an after-visit summary and questions were answered concerning future plans.  I have discussed medication side effects and warnings with the patient as well.    Vernia Buff, MD  [facility]  11/09/19

## 2019-11-09 NOTE — Progress Notes (Signed)
1. Have you been to the ER, urgent care clinic since your last visit?  Hospitalized since your last visit?Yes When: [facility]  for fall at home in shower     2. Have you seen or consulted any other health care providers outside of the [facility] since your last visit?  Include any pap smears or colon screening. No    Reviewed record in preparation for visit and have necessary documentation  Goals that were addressed and/or need to be completed during or after this appointment include     Health Maintenance Due   Topic Date Due   ??? COVID-19 Vaccine (1) Never done   ??? PAP AKA CERVICAL CYTOLOGY  08/15/2019       Patient is accompanied by self I have received verbal consent from Brandi Clark to discuss any/all medical information while they are present in the room.

## 2019-11-29 ENCOUNTER — Encounter

## 2019-11-30 MED ORDER — HYDROXYZINE 50 MG TAB
50 mg | ORAL_TABLET | ORAL | 1 refills | Status: DC
Start: 2019-11-30 — End: 2020-02-20

## 2019-12-07 ENCOUNTER — Encounter: Attending: Family Medicine | Primary: Family Medicine

## 2019-12-14 ENCOUNTER — Encounter: Payer: MEDICAID | Attending: Family Medicine | Primary: Family Medicine

## 2020-01-01 ENCOUNTER — Encounter: Payer: MEDICAID | Attending: Family Medicine | Primary: Family Medicine

## 2020-01-12 ENCOUNTER — Ambulatory Visit: Admit: 2020-01-12 | Discharge: 2020-01-12 | Payer: MEDICAID | Attending: Family Medicine | Primary: Family Medicine

## 2020-01-12 ENCOUNTER — Ambulatory Visit: Attending: Family Medicine | Primary: Family Medicine

## 2020-01-12 DIAGNOSIS — Z01419 Encounter for gynecological examination (general) (routine) without abnormal findings: Secondary | ICD-10-CM

## 2020-01-12 MED ORDER — AMITRIPTYLINE 25 MG TAB
25 mg | ORAL_TABLET | Freq: Every evening | ORAL | 0 refills | Status: DC
Start: 2020-01-12 — End: 2020-04-16

## 2020-01-12 NOTE — Progress Notes (Signed)
 Chief Complaint   Patient presents with   . Well Woman     Visit Vitals  BP 125/85 (BP 1 Location: Right arm, BP Patient Position: Sitting, BP Cuff Size: Adult)   Pulse 82   Temp 97.7 F (36.5 C) (Temporal)   Resp 18   Ht 5' 6 (1.676 m)   Wt 207 lb 9.6 oz (94.2 kg)   LMP 12/18/2019 (Approximate)   SpO2 98%   BMI 33.51 kg/m     1. Have you been to the ER, urgent care clinic since your last visit?  Hospitalized since your last visit?No    2. Have you seen or consulted any other health care providers outside of the [facility] since your last visit?  Include any pap smears or colon screening. No    Reviewed record in preparation for visit and have necessary documentation  Pt did not bring medication to office visit for review  opportunity was given for questions  Goals that were addressed and/or need to be completed during or after this appointment include   Health Maintenance Due   Topic Date Due   . COVID-19 Vaccine (1) Never done   . PAP AKA CERVICAL CYTOLOGY  08/15/2019

## 2020-01-12 NOTE — Progress Notes (Signed)
[facility]    History of Present Illness:   Brandi Clark is a 44 y.o. female with history of Eczema, Anxiety, Low back pain  CC: Well woman exam  History provided by patient and Records    HPI:    Well Woman exam:  Brandi Clark is a 44 y.o. female presenting for well woman exam.     How would you rate your health in generally over the last year? Fair  Has your physical and emotional health limited your social activities with family or friends? no  Ability to perform activities of daily living? completes all daily living activities without any functional limitations    Current Complaints? Fatigue (See attached Problem visit note if applicable)    Menstrual/Sexual History:  Age at which menses began: 44 y.o.  Last menstrual period was 2 weeks ago  Length of periods: 4-7  Number of days between periods: 28 days  Menstrual flow: Heavy at first    PAP History: Normal    G3 P2013  Sexually active: Yes  Number of sexual partners: 1  Type of sexual partners: Husband  Method of family planning: Vasectomy    Risk factors for breast cancer: Low    Diet/Exercise History:  Diet: Favorite food Breakfast food Berniece Salines).    - Does patient eat at least 5 servings of Fruits/vegetables daily? Yes   - Is patient currently dieting? Yes    Exercise: Patient does not have structured exercise  - Does patient get 150 minutes of structured exercise weekly? 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 None    Healthcare maintenance:   Health Maintenance Due   Topic Date Due   ??? COVID-19 Vaccine (1) Never done   ??? PAP AKA CERVICAL CYTOLOGY  08/15/2019     Mammogram indicated? No   Colonoscopy indicated?  No   DEXA scan indicated?  No   HIV/STI testing indicated?  No   Hepatitis C testing indicated?  No   Lung cancer screening indicated?  No   AAA screening indicated?  No     Immunization History   Administered Date(s) Administered   ??? Influenza Vaccine [employer]) PF (>6 Mo Flulaval,  Fluarix, and >3 Yrs Afluria, Fluzone 84132) 03/02/2017, 03/14/2018   ??? Tdap 08/17/2017     Flu indicated? No   Tdap indicated? No   Pneumovax indicated? No   Zostervax indicated? No   Meningococcal indicated? No       Medications Reviewed  Current Outpatient Medications on File Prior to Visit   Medication Sig Dispense Refill   ??? hydrOXYzine HCL (ATARAX) 50 mg tablet TAKE 1 TABLET BY MOUTH EVERY 6 HOURS AS NEEDED FOR ANXIETY 90 Tablet 1   ??? lidocaine (LIDODERM) 5 % Apply 1-2 patch to the affected area for 12 hours a day and remove for 12 hours a day.  Apply to right hip and left arm 90 Each 1   ??? sertraline (ZOLOFT) 100 mg tablet Take 1 tablet by mouth once daily 90 Tab 1   ??? ibuprofen (MOTRIN) 800 mg tablet Take 1 Tab by mouth every eight (8) hours as needed for Pain. 90 Tab 1   ??? fluticasone propionate (FLONASE) 50 mcg/actuation nasal spray 2 Sprays by Both Nostrils route daily. 1 Bottle 1   ??? potassium 99 mg tablet Take 99 mg by mouth daily. Indications: Pt takes 0.5 tab daily     ??? tiZANidine (ZANAFLEX) 4  mg tablet Take half or one full tablet up to 3 times daily as needed for pain.  Recommend start taking nightly as needed first. (Patient not taking: Reported on 01/12/2020) 30 Tab 1   ??? triamcinolone acetonide (KENALOG) 0.025 % topical cream Apply  to affected area two (2) times a day. use thin layer 15 g 0     No current facility-administered medications on file prior to visit.          Past Medical, Family, and Social History:     Allergies Reviewed:  Allergies   Allergen Reactions   ??? Codeine Rash and Nausea Only     Other reaction(s): GI intolerance       Medical History Reviewed  Current Outpatient Medications on File Prior to Visit   Medication Sig Dispense Refill   ??? hydrOXYzine HCL (ATARAX) 50 mg tablet TAKE 1 TABLET BY MOUTH EVERY 6 HOURS AS NEEDED FOR ANXIETY 90 Tablet 1   ??? lidocaine (LIDODERM) 5 % Apply 1-2 patch to the affected area for 12 hours a day and remove for 12 hours a day.  Apply to right  hip and left arm 90 Each 1   ??? sertraline (ZOLOFT) 100 mg tablet Take 1 tablet by mouth once daily 90 Tab 1   ??? ibuprofen (MOTRIN) 800 mg tablet Take 1 Tab by mouth every eight (8) hours as needed for Pain. 90 Tab 1   ??? fluticasone propionate (FLONASE) 50 mcg/actuation nasal spray 2 Sprays by Both Nostrils route daily. 1 Bottle 1   ??? potassium 99 mg tablet Take 99 mg by mouth daily. Indications: Pt takes 0.5 tab daily     ??? tiZANidine (ZANAFLEX) 4 mg tablet Take half or one full tablet up to 3 times daily as needed for pain.  Recommend start taking nightly as needed first. (Patient not taking: Reported on 01/12/2020) 30 Tab 1   ??? triamcinolone acetonide (KENALOG) 0.025 % topical cream Apply  to affected area two (2) times a day. use thin layer 15 g 0     No current facility-administered medications on file prior to visit.       Surgical History Reviewed  Past Surgical History:   Procedure Laterality Date   ??? HX TUBAL LIGATION Bilateral 2003       Family History Reviewed  Family History   Problem Relation Age of Onset   ??? Other Mother         fibroid tumor   ??? No Known Problems Father        Social History Reviewed  Social History     Socioeconomic History   ??? Marital status: DIVORCED     Spouse name: Not on file   ??? Number of children: Not on file   ??? Years of education: Not on file   ??? Highest education level: Not on file   Occupational History   ??? Not on file   Tobacco Use   ??? Smoking status: Former Smoker     Packs/day: 1.00     Types: Cigarettes     Quit date: 08/18/2017     Years since quitting: 2.4   ??? Smokeless tobacco: Never Used   Substance and Sexual Activity   ??? Alcohol use: No   ??? Drug use: No     Types: IV   ??? Sexual activity: Yes     Partners: Male   Other Topics Concern   ??? Not on file   Social History Narrative  Breast Cancer Risk Factors update 08/14/16    # of Children:  G3 P2013    Age at Delivery of First Child: 17.      Hysterectomy/oophorectomy:  No/No.      Breast Bx: No.      Hx of Breast  Feeding:  no.      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     Hormone therapy: No.     FamHx of breast ca in first degree relative: No     Social Determinants of Company secretary Strain:    ??? Difficulty of Paying Living Expenses:    Food Insecurity:    ??? Worried About Charity fundraiser in the Last Year:    ??? Arboriculturist in the Last Year:    Transportation Needs:    ??? Film/video editor (Medical):    ??? Lack of Transportation (Non-Medical):    Physical Activity:    ??? Days of Exercise per Week:    ??? Minutes of Exercise per Session:    Stress:    ??? Feeling of Stress :    Social Connections:    ??? Frequency of Communication with Friends and Family:    ??? Frequency of Social Gatherings with Friends and Family:    ??? Attends Religious Services:    ??? Marine scientist or Organizations:    ??? Attends Music therapist:    ??? Marital Status:    Intimate Production manager Violence:    ??? Fear of Current or Ex-Partner:    ??? Emotionally Abused:    ??? Physically Abused:    ??? Sexually Abused:        Review of Systems   Review of Systems   Constitutional: Positive for malaise/fatigue. Negative for chills and fever.   HENT: Negative for hearing loss and tinnitus.    Cardiovascular: Negative for chest pain and palpitations.   Gastrointestinal: Negative for nausea and vomiting.       Objective:     Visit Vitals  BP 125/85 (BP 1 Location: Right arm, BP Patient Position: Sitting, BP Cuff Size: Adult)   Pulse 82   Temp 97.7 ??F (36.5 ??C) (Temporal)   Resp 18   Ht 5' 6" (1.676 m)   Wt 207 lb 9.6 oz (94.2 kg)   LMP 12/18/2019 (Approximate)   SpO2 98%   BMI 33.51 kg/m??        Physical Exam  Vitals and nursing note reviewed. Exam conducted with a chaperone present.   Constitutional:       Appearance: Normal appearance.   HENT:      Head: Normocephalic and atraumatic.   Cardiovascular:      Rate and Rhythm: Normal rate and regular rhythm.      Pulses: Normal pulses.      Heart sounds: Normal heart sounds. No murmur  heard.   No friction rub. No gallop.    Pulmonary:      Effort: Pulmonary effort is normal.      Breath sounds: Normal breath sounds.   Abdominal:      General: Abdomen is flat. Bowel sounds are normal.      Palpations: Abdomen is soft.   Genitourinary:     General: Normal vulva.      Labia:         Right: No rash.         Left: No rash.  Vagina: Normal.      Cervix: Normal.      Comments: Cervix Midline and normal  Musculoskeletal:         General: Normal range of motion.      Cervical back: Normal range of motion and neck supple.   Neurological:      Mental Status: She is alert.         Pertinent Labs/Studies:  Lab Results   Component Value Date/Time    Cholesterol, total 135 12/29/2016 09:40 AM    HDL Cholesterol 38 (L) 12/29/2016 09:40 AM    LDL, calculated 76 12/29/2016 09:40 AM    VLDL, calculated 21 12/29/2016 09:40 AM    Triglyceride 105 12/29/2016 09:40 AM     Lab Results   Component Value Date/Time    Sodium 142 03/14/2018 02:50 PM    Potassium 3.9 03/14/2018 02:50 PM    Chloride 106 03/14/2018 02:50 PM    CO2 23 03/14/2018 02:50 PM    Glucose 76 03/14/2018 02:50 PM    BUN 9 03/14/2018 02:50 PM    Creatinine 0.76 03/14/2018 02:50 PM    BUN/Creatinine ratio 12 03/14/2018 02:50 PM    GFR est AA 113 03/14/2018 02:50 PM    GFR est non-AA 98 03/14/2018 02:50 PM    Calcium 8.9 03/14/2018 02:50 PM    Bilirubin, total 0.3 09/09/2017 12:08 PM    Alk. phosphatase 47 09/09/2017 12:08 PM    Protein, total 7.5 09/09/2017 12:08 PM    Albumin 4.7 09/09/2017 12:08 PM    A-G Ratio 1.7 09/09/2017 12:08 PM    ALT (SGPT) 10 09/09/2017 12:08 PM     Lab Results   Component Value Date/Time    WBC 7.9 03/14/2018 02:50 PM    Hemoglobin, POC 15.6 08/17/2017 04:04 PM    HGB 12.1 03/14/2018 02:50 PM    Hematocrit, POC 46 08/17/2017 04:04 PM    HCT 35.4 03/14/2018 02:50 PM    PLATELET 214 03/14/2018 02:50 PM    MCV 89 03/14/2018 02:50 PM         Assessment and orders:       ICD-10-CM ICD-9-CM    1. Well woman exam  Z01.419 V72.31  CBC W/O DIFF      METABOLIC PANEL, COMPREHENSIVE   2. Dyslipidemia  E78.5 272.4 LIPID PANEL   3. Cervical cancer screening  Z12.4 V76.2 PAP IG, APTIMA HPV AND RFX 16/18,45 ([id number])      PAP IG, APTIMA HPV AND RFX 16/18,45 ([id number])   4. Primary insomnia  F51.01 307.42 amitriptyline (ELAVIL) 25 mg tablet     Diagnoses and all orders for this visit:    1. Well woman exam: Patient is overall well (see attached note for pertinent problem visit if needed).  After review of medical history and current health patient was counseled on Exercise and given age appropriate information in her AVS.  Appropriate vaccinations, labs, imaging, and referrals were ordered as listed below.  Medications were ordered as need for management of stable chronic conditions.  We discussed her BMI with plan: Continue Diet.  We discussed use of Alcohol, Tobacco, and illicit drugs and appropriate counseling was given as needed.  -     CBC W/O DIFF; Future  -     METABOLIC PANEL, COMPREHENSIVE; Future    2. Dyslipidemia: The patient is aware of our goal to reduce or eliminate the long term problems (such as strokes and heart attacks) related to poorly controlled Triglycerides, LDL, Cholesterol.   -  LIPID PANEL; Future    3. Cervical cancer screening  -     PAP IG, APTIMA HPV AND RFX 16/18,45 ([id number]); Future    4. Primary insomnia  -     amitriptyline (ELAVIL) 25 mg tablet; Take 1 Tablet by mouth nightly.      Follow-up and Dispositions    ?? Return in about 4 weeks (around 02/09/2020).           I have discussed the diagnosis with the patient and the intended plan as seen in the above orders.  Social history, medical history, and labs were reviewed.  The patient has received an after-visit summary and questions were answered concerning future plans.  I have discussed medication side effects and warnings with the patient as well.    Vernia Buff, MD  [facility]  01/12/20

## 2020-01-17 LAB — COMPREHENSIVE METABOLIC PANEL
ALT: 26 IU/L (ref 0–32)
AST: 28 IU/L (ref 0–40)
Albumin/Globulin Ratio: 1.9 NA (ref 1.2–2.2)
Albumin: 4.7 g/dL (ref 3.8–4.8)
Alkaline Phosphatase: 43 IU/L — ABNORMAL LOW (ref 48–121)
BUN: 18 mg/dL (ref 6–24)
Bun/Cre Ratio: 22 NA (ref 9–23)
CO2: 20 mmol/L (ref 20–29)
Calcium: 9.4 mg/dL (ref 8.7–10.2)
Chloride: 104 mmol/L (ref 96–106)
Creatinine: 0.82 mg/dL (ref 0.57–1.00)
EGFR IF NonAfrican American: 89 mL/min/{1.73_m2} (ref >59)
GFR African American: 102 mL/min/{1.73_m2} (ref >59)
Globulin, Total: 2.5 g/dL (ref 1.5–4.5)
Glucose: 92 mg/dL (ref 65–99)
Potassium: 4.3 mmol/L (ref 3.5–5.2)
Sodium: 139 mmol/L (ref 134–144)
Total Bilirubin: <0.2 mg/dL (ref 0.0–1.2)
Total Protein: 7.2 g/dL (ref 6.0–8.5)

## 2020-01-17 LAB — CBC
Hematocrit: 41.6 % (ref 34.0–46.6)
Hemoglobin: 13.6 g/dL (ref 11.1–15.9)
MCH: 28.6 pg (ref 26.6–33.0)
MCHC: 32.7 g/dL (ref 31.5–35.7)
MCV: 87 fL (ref 79–97)
Platelets: 235 10*3/uL (ref 150–450)
RBC: 4.76 x10E6/uL (ref 3.77–5.28)
RDW: 12.2 % (ref 11.7–15.4)
WBC: 8 10*3/uL (ref 3.4–10.8)

## 2020-01-17 LAB — LIPID PANEL
Cholesterol, Total: 244 mg/dL — ABNORMAL HIGH (ref 100–199)
Cholesterol, total: 244 mg/dL — ABNORMAL HIGH (ref 100–199)
HDL Cholesterol: 41 mg/dL (ref >39)
HDL: 41 mg/dL (ref >39)
LDL Calculated: 144 mg/dL — ABNORMAL HIGH (ref 0–99)
LDL, calculated: 144 mg/dL — ABNORMAL HIGH (ref 0–99)
Triglyceride: 320 mg/dL — ABNORMAL HIGH (ref 0–149)
Triglycerides: 320 mg/dL — ABNORMAL HIGH (ref 0–149)
VLDL, calculated: 59 mg/dL — ABNORMAL HIGH (ref 5–40)
VLDL: 59 mg/dL — ABNORMAL HIGH (ref 5–40)

## 2020-01-17 LAB — METABOLIC PANEL, COMPREHENSIVE
A-G Ratio: 1.9 (ref 1.2–2.2)
ALT (SGPT): 26 IU/L (ref 0–32)
AST (SGOT): 28 IU/L (ref 0–40)
Albumin: 4.7 g/dL (ref 3.8–4.8)
Alk. phosphatase: 43 IU/L — ABNORMAL LOW (ref 48–121)
BUN/Creatinine ratio: 22 (ref 9–23)
BUN: 18 mg/dL (ref 6–24)
Bilirubin, total: <0.2 mg/dL (ref 0.0–1.2)
CO2: 20 mmol/L (ref 20–29)
Calcium: 9.4 mg/dL (ref 8.7–10.2)
Chloride: 104 mmol/L (ref 96–106)
Creatinine: 0.82 mg/dL (ref 0.57–1.00)
GFR est AA: 102 mL/min/{1.73_m2} (ref >59)
GFR est non-AA: 89 mL/min/{1.73_m2} (ref >59)
GLOBULIN, TOTAL: 2.5 g/dL (ref 1.5–4.5)
Glucose: 92 mg/dL (ref 65–99)
Potassium: 4.3 mmol/L (ref 3.5–5.2)
Protein, total: 7.2 g/dL (ref 6.0–8.5)
Sodium: 139 mmol/L (ref 134–144)

## 2020-01-17 LAB — CBC W/O DIFF
HCT: 41.6 % (ref 34.0–46.6)
HGB: 13.6 g/dL (ref 11.1–15.9)
MCH: 28.6 pg (ref 26.6–33.0)
MCHC: 32.7 g/dL (ref 31.5–35.7)
MCV: 87 fL (ref 79–97)
PLATELET: 235 10*3/uL (ref 150–450)
RBC: 4.76 x10E6/uL (ref 3.77–5.28)
RDW: 12.2 % (ref 11.7–15.4)
WBC: 8 10*3/uL (ref 3.4–10.8)

## 2020-01-18 LAB — PAP IG, APTIMA HPV AND RFX 16/18,45 (199305)
HPV Aptima: POSITIVE — AB
HPV GENOTYPE 18,45: POSITIVE — AB
HPV GENOTYPE, 16, 507811: NEGATIVE
LABCORP 019018: 0

## 2020-01-18 LAB — PAP IG, APTIMA HPV AND RFX 16/18,45 (507805)
.: 0
HPV APTIMA: POSITIVE — AB
HPV GENOTYPE 18,45: POSITIVE — AB
HPV GENOTYPE, 16: NEGATIVE

## 2020-02-18 ENCOUNTER — Encounter

## 2020-02-20 MED ORDER — HYDROXYZINE 50 MG TAB
50 mg | ORAL_TABLET | ORAL | 1 refills | Status: DC
Start: 2020-02-20 — End: 2020-06-04

## 2020-02-26 ENCOUNTER — Encounter: Payer: MEDICAID | Primary: Family Medicine

## 2020-03-11 ENCOUNTER — Encounter

## 2020-03-11 MED ORDER — CYCLOBENZAPRINE 10 MG TAB
10 mg | ORAL_TABLET | ORAL | 0 refills | Status: DC
Start: 2020-03-11 — End: 2020-04-16

## 2020-03-20 ENCOUNTER — Encounter

## 2020-03-20 MED ORDER — KETOROLAC TROMETHAMINE 10 MG TAB
10 mg | ORAL_TABLET | Freq: Four times a day (QID) | ORAL | 0 refills | Status: DC | PRN
Start: 2020-03-20 — End: 2020-09-06

## 2020-03-20 MED ORDER — PREDNISONE 20 MG TAB
20 mg | ORAL_TABLET | Freq: Every day | ORAL | 0 refills | Status: AC
Start: 2020-03-20 — End: 2020-03-25

## 2020-03-20 NOTE — Telephone Encounter (Signed)
Telephone Encounter by Altamese Cabal, MD at 03/20/20 1818                Author: Altamese Cabal, MD  Service: --  Author Type: Physician       Filed: 03/20/20 1818  Encounter Date: 03/20/2020  Status: Signed          Editor: Altamese Cabal, MD (Physician)          From: Tor Netters DickersonTo: Altamese Cabal, MDSent: 03/20/2020  3:17 PM EDTSubject: Non-Urgent Medical QuestionSo.Marland Kitcheni went to the ER..had xrays done  except on my elbow. They saw nothing on those,and they think it's my rotator cuff and a pinched nerve. They gave me a pain pill that i can remember and a valium. That stopped the pain. Been in pain ever since. They told me i need to see an orthopedic.  Im really in pain and cant sleep. Please help me! Have a good day.

## 2020-04-01 ENCOUNTER — Encounter

## 2020-04-01 MED ORDER — SERTRALINE 100 MG TAB
100 mg | ORAL_TABLET | ORAL | 1 refills | Status: DC
Start: 2020-04-01 — End: 2020-09-06

## 2020-04-16 ENCOUNTER — Ambulatory Visit
Admit: 2020-04-16 | Discharge: 2020-04-16 | Payer: PRIVATE HEALTH INSURANCE | Attending: Family Medicine | Primary: Family Medicine

## 2020-04-16 ENCOUNTER — Ambulatory Visit: Attending: Family Medicine | Primary: Family Medicine

## 2020-04-16 DIAGNOSIS — M25512 Pain in left shoulder: Secondary | ICD-10-CM

## 2020-04-16 NOTE — Progress Notes (Signed)
[facility]    History of Present Illness:   Brandi Clark is a 44 y.o. female with history of Chronic Low back pain, Anxiety, Eczema  CC: Left shoulder pain  History provided by patient and Records    HPI:  Shoulder Pain:  Reports pain of the Left  Shoulder.  Pain secondary to 2 falls. Overall symptoms are are worsening.  Secondary to fall on ground and in Bathroom.  Patientt supposed to have Ortho follow up but did not go.  Location: Left  shoulder - Upper shoulder  Duration: Pain/Injury started 1 month ago.  Pain lasts Constant and continues.  Quality: aching and throbbing in characteristic.  Severity: pain is perceived as severe (6-8 pain scale)  Onset: insidious  Radiation: Noting arm and hand.   Other Symptoms:  Denies no other symptoms.  Patient does not endorse weakness/tingling sensation.  Modifying Factors: Pain is worsened by elevation, activity and repetitive activity.  Pain is improved by heat and rest.  Previous remedies tried include Prednisone, Toradol, Motrin, Tylenol, Aleve that take the edge off only  Previous Injuries/Surgeries: None  Previous Imaging: X-Ray in the ER.  [place] [facility].    Anxiety/Depresion: Taking zoloft.    Health Maintenance  Health Maintenance Due   Topic Date Due   ??? COVID-19 Vaccine (1) Never done       Past Medical, Family, and Social History:     Current Outpatient Medications on File Prior to Visit   Medication Sig Dispense Refill   ??? sertraline (ZOLOFT) 100 mg tablet Take 1 tablet by mouth once daily 90 Tablet 1   ??? ketorolac (TORADOL) 10 mg tablet Take 1 Tablet by mouth every six (6) hours as needed for Pain. 20 Tablet 0   ??? hydrOXYzine HCL (ATARAX) 50 mg tablet TAKE 1 TABLET BY MOUTH EVERY 6 HOURS AS NEEDED FOR ANXIETY 90 Tablet 1   ??? lidocaine (LIDODERM) 5 % Apply 1-2 patch to the affected area for 12 hours a day and remove for 12 hours a day.  Apply to right hip and left arm 90 Each 1   ??? ibuprofen (MOTRIN) 800 mg tablet Take 1 Tab by mouth  every eight (8) hours as needed for Pain. 90 Tab 1   ??? fluticasone propionate (FLONASE) 50 mcg/actuation nasal spray 2 Sprays by Both Nostrils route daily. 1 Bottle 1   ??? potassium 99 mg tablet Take 99 mg by mouth daily. Indications: Pt takes 0.5 tab daily     ??? cyclobenzaprine (FLEXERIL) 10 mg tablet Take 1/2 to 1 tab every 8 hours as needed (Patient not taking: Reported on 04/16/2020) 30 Tablet 0   ??? amitriptyline (ELAVIL) 25 mg tablet Take 1 Tablet by mouth nightly. 30 Tablet 0   ??? tiZANidine (ZANAFLEX) 4 mg tablet Take half or one full tablet up to 3 times daily as needed for pain.  Recommend start taking nightly as needed first. (Patient not taking: Reported on 01/12/2020) 30 Tab 1   ??? triamcinolone acetonide (KENALOG) 0.025 % topical cream Apply  to affected area two (2) times a day. use thin layer 15 g 0     No current facility-administered medications on file prior to visit.       Patient Active Problem List   Diagnosis Code   ??? Anxiety F41.9   ??? Elevated blood pressure reading without diagnosis of hypertension R03.0   ??? Chronic pain of left knee M25.562, G89.29   ??? Chronic bilateral low back  pain with left-sided sciatica M54.42, G89.29   ??? Eczema L30.9       Social History     Socioeconomic History   ??? Marital status: DIVORCED     Spouse name: Not on file   ??? Number of children: Not on file   ??? Years of education: Not on file   ??? Highest education level: Not on file   Occupational History   ??? Not on file   Tobacco Use   ??? Smoking status: Former Smoker     Packs/day: 1.00     Types: Cigarettes     Quit date: 08/18/2017     Years since quitting: 2.6   ??? Smokeless tobacco: Never Used   Substance and Sexual Activity   ??? Alcohol use: No   ??? Drug use: No     Types: IV   ??? Sexual activity: Yes     Partners: Male   Other Topics Concern   ??? Not on file   Social History Narrative    Breast Cancer Risk Factors update 08/14/16    # of Children:  G3 P2013    Age at Delivery of First Child: 24.       Hysterectomy/oophorectomy:  No/No.      Breast Bx: No.      Hx of Breast Feeding:  no.      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     Hormone therapy: No.     FamHx of breast ca in first degree relative: No     Social Determinants of Psychologist, prison and probation services Strain:    ??? Difficulty of Paying Living Expenses:    Food Insecurity:    ??? Worried About Programme researcher, broadcasting/film/video in the Last Year:    ??? Barista in the Last Year:    Transportation Needs:    ??? Freight forwarder (Medical):    ??? Lack of Transportation (Non-Medical):    Physical Activity:    ??? Days of Exercise per Week:    ??? Minutes of Exercise per Session:    Stress:    ??? Feeling of Stress :    Social Connections:    ??? Frequency of Communication with Friends and Family:    ??? Frequency of Social Gatherings with Friends and Family:    ??? Attends Religious Services:    ??? Database administrator or Organizations:    ??? Attends Engineer, structural:    ??? Marital Status:    Intimate Programme researcher, broadcasting/film/video Violence:    ??? Fear of Current or Ex-Partner:    ??? Emotionally Abused:    ??? Physically Abused:    ??? Sexually Abused:        Review of Systems   Review of Systems   Constitutional: Negative for chills and fever.   Cardiovascular: Negative for chest pain and palpitations.   Gastrointestinal: Negative for nausea and vomiting.   Musculoskeletal: Positive for joint pain.   Neurological: Negative for headaches.       Objective:     Visit Vitals  BP 129/85 (BP 1 Location: Right arm, BP Patient Position: Sitting)   Pulse 81   Temp 97.6 ??F (36.4 ??C) (Oral)   Resp 18   Ht 5\' 6"  (1.676 m)   Wt 205 lb (93 kg)   LMP 04/02/2020   SpO2 98%   BMI 33.09 kg/m??        Physical Exam  Vitals and  nursing note reviewed.   Constitutional:       Appearance: Normal appearance.   HENT:      Head: Normocephalic and atraumatic.   Cardiovascular:      Rate and Rhythm: Normal rate and regular rhythm.      Pulses: Normal pulses.      Heart sounds: Normal heart sounds. No murmur heard.   No  friction rub. No gallop.    Pulmonary:      Effort: Pulmonary effort is normal.      Breath sounds: Normal breath sounds.   Abdominal:      General: Abdomen is flat. Bowel sounds are normal.      Palpations: Abdomen is soft.   Musculoskeletal:      Cervical back: Normal range of motion and neck supple.      Comments: Left Shoulder: Tenderness on the upper shoulder.  Pain with resisted external and internal rotation.  Decreased strength againts external rotation.   Neurological:      Mental Status: She is alert.         Pertinent Labs/Studies:      Assessment and orders:       ICD-10-CM ICD-9-CM    1. Acute pain of left shoulder  M25.512 719.41 REFERRAL TO ORTHOPEDIC SURGERY      MRI SHOULDER LT WO CONT      REFERRAL TO PHYSICAL THERAPY   2. Anxiety  F41.9 300.00      Diagnoses and all orders for this visit:    1. Acute pain of left shoulder: Referral for Ortho now, Imaging ordered and getting PT on board as well.  -     REFERRAL TO ORTHOPEDIC SURGERY  -     MRI SHOULDER LT WO CONT; Future  -     REFERRAL TO PHYSICAL THERAPY      Follow-up and Dispositions    ?? Return in about 4 weeks (around 05/14/2020).           I have discussed the diagnosis with the patient and the intended plan as seen in the above orders.  Social history, medical history, and labs were reviewed.  The patient has received an after-visit summary and questions were answered concerning future plans.  I have discussed medication side effects and warnings with the patient as well.    Altamese Cabal, MD  [facility]  04/16/20

## 2020-04-16 NOTE — Progress Notes (Signed)
 1. Have you been to the ER, urgent care clinic since your last visit?  Hospitalized since your last visit? no    2. Have you seen or consulted any other health care providers outside of the [facility] since your last visit?  Including any pap smears or colon screening.  no    Reviewed record in preparation for visit and have necessary documentation    Pt did not bring medication to office visit for review  Opportunity was given for questions    Goals that were addressed and/or need to be completed during or after this appointment include   Health Maintenance Due   Topic Date Due   . COVID-19 Vaccine (1) Never done   . Flu Vaccine (1) 02/14/2020     Body mass index is 33.09 kg/m.

## 2020-04-19 ENCOUNTER — Encounter

## 2020-04-19 MED ORDER — HYDROCODONE-ACETAMINOPHEN 5 MG-325 MG TAB
5-325 mg | ORAL_TABLET | Freq: Three times a day (TID) | ORAL | 0 refills | Status: DC | PRN
Start: 2020-04-19 — End: 2020-04-26

## 2020-04-19 NOTE — Telephone Encounter (Signed)
Telephone Encounter by Altamese Cabal, MD at 04/19/20 1831                Author: Altamese Cabal, MD  Service: --  Author Type: Physician       Filed: 04/19/20 1831  Encounter Date: 04/19/2020  Status: Signed          Editor: Altamese Cabal, MD (Physician)          From: Brandi Netters DickersonTo: Altamese Cabal, MDSent: 04/19/2020 11:34 AM EDTSubject: MRI and painGood morning..my MRI is scheduled for this Wed Nov  10. I need help.Marland Kitcheni need some kind of relief. Please tell me what to do. Even my bra strap hurts. It hurts all the way down to my forearm. Didnt sleep last night and i cant miss work. Please advise

## 2020-04-24 ENCOUNTER — Inpatient Hospital Stay: Admit: 2020-04-24 | Payer: PRIVATE HEALTH INSURANCE | Attending: Family Medicine | Primary: Family Medicine

## 2020-04-24 DIAGNOSIS — M19012 Primary osteoarthritis, left shoulder: Secondary | ICD-10-CM

## 2020-04-26 MED ORDER — HYDROCODONE-ACETAMINOPHEN 7.5 MG-325 MG TAB
ORAL_TABLET | Freq: Three times a day (TID) | ORAL | 0 refills | Status: DC | PRN
Start: 2020-04-26 — End: 2020-05-14

## 2020-04-26 NOTE — Addendum Note (Signed)
Addendum Note by Altamese Cabal, MD at 04/26/20 1825                Author: Altamese Cabal, MD  Service: --  Author Type: Physician       Filed: 04/26/20 1825  Encounter Date: 04/19/2020  Status: Signed          Editor: Altamese Cabal, MD (Physician)          Addended by: Altamese Cabal on: 04/26/2020 06:25 PM    Modules accepted: Orders

## 2020-05-07 NOTE — Telephone Encounter (Signed)
Pt would like Dr to call her back

## 2020-05-08 NOTE — Telephone Encounter (Signed)
Message was sent by My Chart.

## 2020-05-13 ENCOUNTER — Encounter

## 2020-05-14 MED ORDER — AMOXICILLIN CLAVULANATE 875 MG-125 MG TAB
875-125 mg | ORAL_TABLET | Freq: Two times a day (BID) | ORAL | 0 refills | Status: AC
Start: 2020-05-14 — End: 2020-05-21

## 2020-05-14 MED ORDER — HYDROCODONE-ACETAMINOPHEN 7.5 MG-325 MG TAB
ORAL_TABLET | Freq: Three times a day (TID) | ORAL | 0 refills | Status: AC | PRN
Start: 2020-05-14 — End: 2020-05-21

## 2020-05-14 NOTE — Telephone Encounter (Signed)
Telephone Encounter by Altamese Cabal, MD at 05/14/20 1442                Author: Altamese Cabal, MD  Service: --  Author Type: Physician       Filed: 05/14/20 1442  Encounter Date: 05/13/2020  Status: Signed          Editor: Altamese Cabal, MD (Physician)          From: Tor Netters DickersonTo: Altamese Cabal, MDSent: 05/13/2020  3:17 PM ESTSubject: Lack of communicationGood afternoon. It seems to me our lack  of communication is only getting worse. You were attentive..you really cared,until i left there. You make me feel like im not important an neither is what i go through. Ive sent messages..returned calls. Still..nothing. I guess now it's a conflict of  interest. You've let me work in pain and u can't answer me. Thank you so much for caring. Thank you for atleast viewing the messages. You have a good day.

## 2020-05-14 NOTE — Addendum Note (Signed)
Addendum Note by Altamese Cabal, MD at 05/14/20 1444                Author: Altamese Cabal, MD  Service: --  Author Type: Physician       Filed: 05/14/20 1444  Encounter Date: 05/13/2020  Status: Signed          Editor: Altamese Cabal, MD (Physician)          Addended by: Linnell Fulling B on: 05/14/2020 02:44 PM    Modules accepted: Orders

## 2020-06-03 ENCOUNTER — Encounter

## 2020-06-04 MED ORDER — HYDROXYZINE 50 MG TAB
50 mg | ORAL_TABLET | ORAL | 0 refills | Status: DC
Start: 2020-06-04 — End: 2020-09-06

## 2020-07-11 MED ORDER — AMOXICILLIN CLAVULANATE 875 MG-125 MG TAB
875-125 mg | ORAL_TABLET | Freq: Two times a day (BID) | ORAL | 0 refills | Status: DC
Start: 2020-07-11 — End: 2020-09-06

## 2020-08-07 ENCOUNTER — Encounter

## 2020-08-08 ENCOUNTER — Encounter

## 2020-08-08 MED ORDER — FLUTICASONE 50 MCG/ACTUATION NASAL SPRAY, SUSP
50 mcg/actuation | Freq: Every day | NASAL | 1 refills | Status: AC
Start: 2020-08-08 — End: ?

## 2020-09-04 ENCOUNTER — Encounter: Payer: PRIVATE HEALTH INSURANCE | Primary: Family Medicine

## 2020-09-06 ENCOUNTER — Ambulatory Visit: Admit: 2020-09-06 | Discharge: 2020-09-06 | Payer: MEDICAID | Attending: Family Medicine | Primary: Family Medicine

## 2020-09-06 ENCOUNTER — Ambulatory Visit: Attending: Family Medicine | Primary: Family Medicine

## 2020-09-06 DIAGNOSIS — G8929 Other chronic pain: Secondary | ICD-10-CM

## 2020-09-06 DIAGNOSIS — M25562 Pain in left knee: Secondary | ICD-10-CM

## 2020-09-06 MED ORDER — SERTRALINE 100 MG TAB
100 mg | ORAL_TABLET | Freq: Every day | ORAL | 1 refills | Status: AC
Start: 2020-09-06 — End: ?

## 2020-09-06 NOTE — Progress Notes (Signed)
[facility]    History of Present Illness:   Brandi Clark is a 45 y.o. female with history of Anxiety, Chronic Bilateral Low back pain, HTN, Chronic pain left knee  CC: Anxiety and joint Pain  History provided by patient and Records    HPI:  Anxiety/Depressed: Patient reports that has stress related to family issues.  Husband changed jobs and also found to be cheating as well.  Patient reports issues with anxiety and depression.      GAD 2/7 09/06/2020   Feeling nervous, anxious, or on edge 3   Not being able to stop or control worrying 3   Worrying too much about different things 3   Trouble relaxing 3   Being so restless that it is hard to sit still 3   Becoming easily annoyed or irritable 3   Feeling afraid as if something awful might happen 3   GAD-7 Total Score 21       3 most recent PHQ Screens 09/06/2020   Little interest or pleasure in doing things Nearly every day   Feeling down, depressed, irritable, or hopeless Nearly every day   Total Score PHQ 2 6   Trouble falling or staying asleep, or sleeping too much Several days   Feeling tired or having little energy Not at all   Poor appetite, weight loss, or overeating Not at all   Feeling bad about yourself - or that you are a failure or have let yourself or your family down More than half the days   Trouble concentrating on things such as school, work, reading, or watching TV More than half the days   Moving or speaking so slowly that other people could have noticed; or the opposite being so fidgety that others notice Several days   Thoughts of being better off dead, or hurting yourself in some way Not at all   PHQ 9 Score 12   How difficult have these problems made it for you to do your work, take care of your home and get along with others Very difficult       Knee Pain: Left knee pain that has been exacerbated overall.  Mild improvement Voltaren at this time.  Will be seeing Dr. Glendon Axe.      Shoulder pain: Exacerbated by stress  currently.    Health Maintenance  Health Maintenance Due   Topic Date Due   ??? COVID-19 Vaccine (3 - Booster for Moderna series) 04/24/2020       Past Medical, Family, and Social History:     Current Outpatient Medications on File Prior to Visit   Medication Sig Dispense Refill   ??? diclofenac EC (VOLTAREN) 75 mg EC tablet Take 75 mg by mouth two (2) times a day.     ??? fluticasone propionate (FLONASE) 50 mcg/actuation nasal spray 2 Sprays by Both Nostrils route daily. 16 g 1   ??? sertraline (ZOLOFT) 100 mg tablet Take 1 tablet by mouth once daily 90 Tablet 1   ??? lidocaine (LIDODERM) 5 % Apply 1-2 patch to the affected area for 12 hours a day and remove for 12 hours a day.  Apply to right hip and left arm 90 Each 1   ??? potassium 99 mg tablet Take 99 mg by mouth daily. Indications: Pt takes 0.5 tab daily     ??? [DISCONTINUED] multivit,calc,mins/iron/folic (ONE-A-DAY WOMENS FORMULA PO)      ??? [DISCONTINUED] amoxicillin-clavulanate (AUGMENTIN) 875-125 mg per tablet Take 1 Tablet by  mouth two (2) times a day. 20 Tablet 0   ??? [DISCONTINUED] hydrOXYzine HCL (ATARAX) 50 mg tablet TAKE 1 TABLET BY MOUTH EVERY 6 HOURS AS NEEDED FOR ANXIETY 90 Tablet 0   ??? [DISCONTINUED] ketorolac (TORADOL) 10 mg tablet Take 1 Tablet by mouth every six (6) hours as needed for Pain. 20 Tablet 0   ??? [DISCONTINUED] ibuprofen (MOTRIN) 800 mg tablet Take 1 Tab by mouth every eight (8) hours as needed for Pain. 90 Tab 1     No current facility-administered medications on file prior to visit.       Patient Active Problem List   Diagnosis Code   ??? Anxiety F41.9   ??? Elevated blood pressure reading without diagnosis of hypertension R03.0   ??? Chronic pain of left knee M25.562, G89.29   ??? 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 Eczema L30.9       Social History     Socioeconomic History   ??? Marital status: DIVORCED   Tobacco Use   ??? Smoking status: Former Smoker     Packs/day: 1.00     Types: Cigarettes     Quit date:  08/18/2017     Years since quitting: 3.0   ??? Smokeless tobacco: Never Used   Substance and Sexual Activity   ??? Alcohol use: No   ??? Drug use: No     Types: IV   ??? Sexual activity: Yes     Partners: Male   Social History Narrative    Breast Cancer Risk Factors update 08/14/16    # of Children:  G3 P2013    Age at Delivery of First Child: 24.      Hysterectomy/oophorectomy:  No/No.      Breast Bx: No.      Hx of Breast Feeding:  no.      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     Hormone therapy: No.     FamHx of breast ca in first degree relative: No        Review of Systems   Review of Systems   Constitutional: Negative for chills and fever.   Cardiovascular: Negative for chest pain and palpitations.   Gastrointestinal: Negative for abdominal pain, nausea and vomiting.   Neurological: Negative for dizziness, tingling and headaches.       Objective:     Visit Vitals  BP (!) 146/99 (BP 1 Location: Left arm, BP Patient Position: Sitting)   Pulse 78   Temp 97.1 ??F (36.2 ??C) (Oral)   Resp 18   Ht 5\' 6"  (1.676 m)   Wt 199 lb 9.6 oz (90.5 kg)   SpO2 100%   BMI 32.22 kg/m??        Physical Exam  Vitals and nursing note reviewed.   Constitutional:       Appearance: Normal appearance.   HENT:      Head: Normocephalic and atraumatic.   Cardiovascular:      Rate and Rhythm: Normal rate and regular rhythm.      Pulses: Normal pulses.      Heart sounds: Normal heart sounds. No murmur heard.  No friction rub. No gallop.    Pulmonary:      Effort: Pulmonary effort is normal.      Breath sounds: Normal breath sounds.   Abdominal:      General: Abdomen is flat. Bowel sounds are normal.      Palpations: Abdomen is soft.  Musculoskeletal:      Cervical back: Normal range of motion and neck supple.   Skin:     General: Skin is warm and dry.   Neurological:      Mental Status: She is alert.         Pertinent Labs/Studies:      Assessment and orders:       ICD-10-CM ICD-9-CM    1. Chronic pain of left knee  M25.562 719.46     G89.29  338.29    2. Anxiety  F41.9 300.00    3. Acute pain of left shoulder  M25.512 719.41    4. Dyslipidemia  E78.5 272.4 CBC W/O DIFF      METABOLIC PANEL, COMPREHENSIVE      LIPID PANEL     Diagnoses and all orders for this visit:    1. Chronic pain of left knee:   -     CBC W/O DIFF; Future  -     METABOLIC PANEL, COMPREHENSIVE; Future    2. Anxiety    3. Acute pain of left shoulder    4. Dyslipidemia  -     LIPID PANEL; Future      Follow-up and Dispositions    ?? Return in about 3 months (around 12/07/2020).           I have discussed the diagnosis with the patient and the intended plan as seen in the above orders.  Social history, medical history, and labs were reviewed.  The patient has received an after-visit summary and questions were answered concerning future plans.  I have discussed medication side effects and warnings with the patient as well.    Altamese Cabal, MD  [facility]  09/06/20

## 2020-09-06 NOTE — Progress Notes (Signed)
 1. Have you been to the ER, urgent care clinic since your last visit?  Hospitalized since your last visit?Yes [place]  knee    2. Have you seen or consulted any other health care providers outside of the [facility] since your last visit?  Including any pap smears or colon screening. No      Health Maintenance Due   Topic Date Due   . COVID-19 Vaccine (3 - Booster for Moderna series) 04/24/2020

## 2020-10-31 ENCOUNTER — Inpatient Hospital Stay: Admit: 2020-10-31 | Discharge: 2020-10-31 | Payer: MEDICAID | Attending: Emergency Medicine

## 2020-10-31 DIAGNOSIS — Z5321 Procedure and treatment not carried out due to patient leaving prior to being seen by health care provider: Secondary | ICD-10-CM

## 2020-10-31 NOTE — ED Notes (Signed)
Pt arrives to ED for soreness to right side of face after slipping into shower and struck my head on shower shelf. Pt also c/o of right knee pain. Pt denies LOC or blood thinners.

## 2020-10-31 NOTE — ED Notes (Signed)
Pt eloped.

## 2020-11-01 NOTE — Progress Notes (Signed)
error 

## 2023-06-21 IMAGING — MR MRI CERVICAL SPINE WITHOUT CONTRAST
4 of 5 series · 24 of 48 positions shown · non-contrast
Comparison: None available.

﻿EXAM:  [id number]   MRI CERVICAL SPINE WITHOUT CONTRAST
INDICATION: Cervical radiculopathy.
TECHNIQUE: Noncontrast multiplanar, multisequence MRI was performed.

[Series 8: T2 · sagittal · 3.5mm · 0.75mm/px · 8 of 13 slices shown (1 of 2)]
[im 1/13]
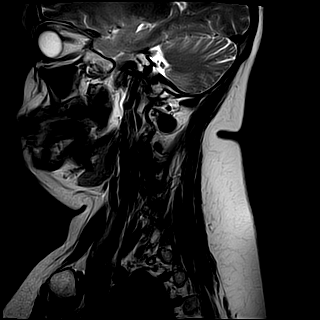
[im 2/13]
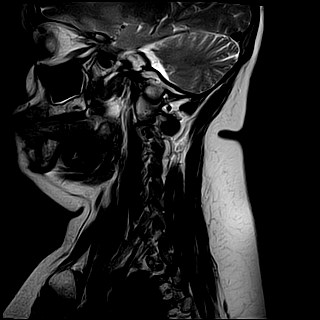
[im 4/13]
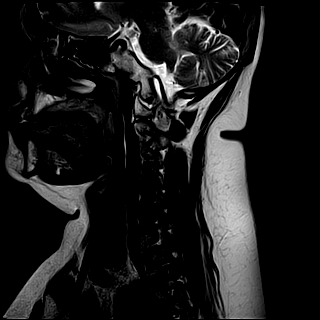
[im 6/13]
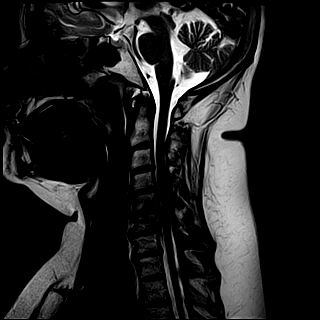
[im 7/13]
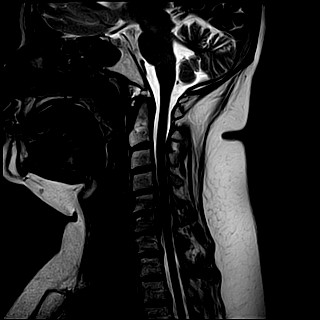
[im 9/13]
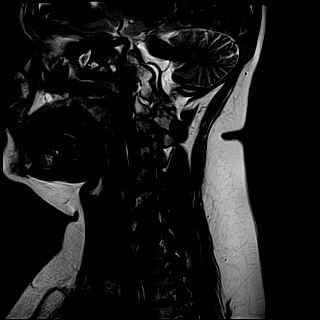
[im 11/13]
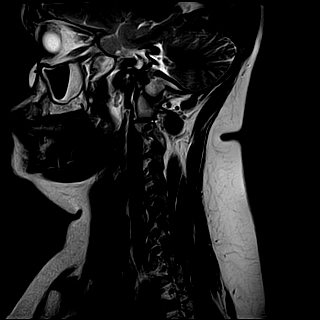
[im 13/13]
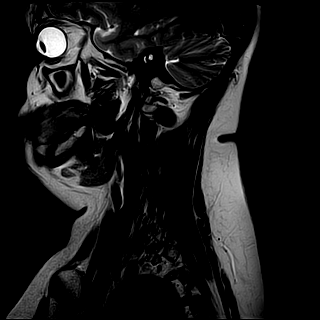

[Series 9: T1 · sagittal · 3.5mm · 0.47mm/px · 4 of 13 slices shown]
[im 1/13]
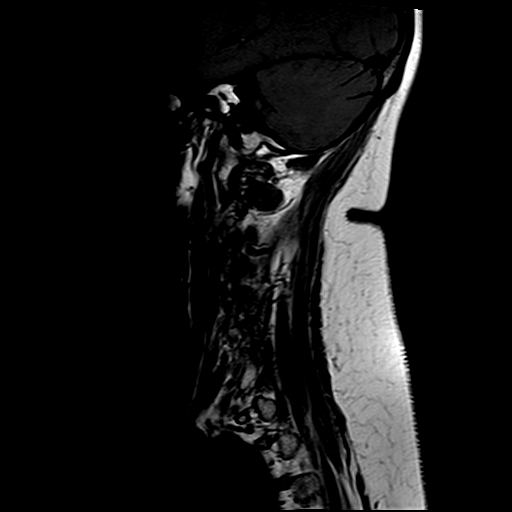
[im 2/13]
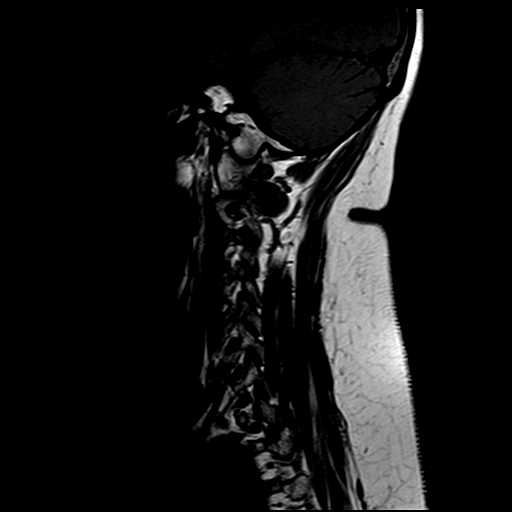
[im 7/13]
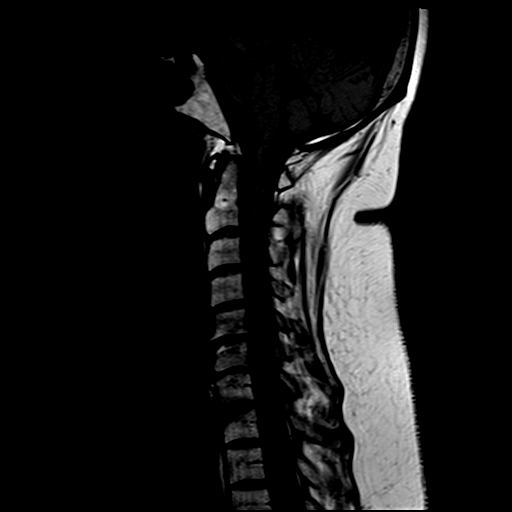
[im 11/13]
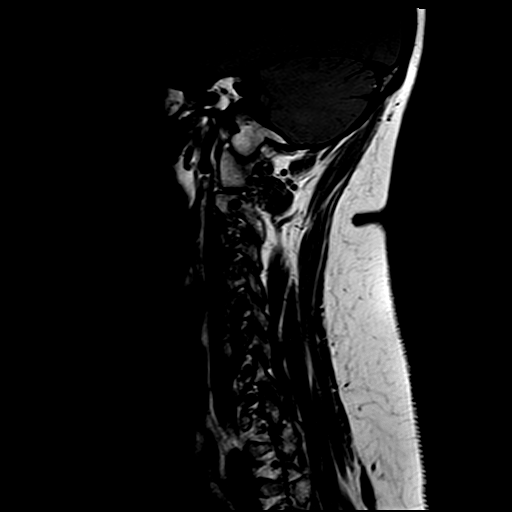

[Series 10: STIR · sagittal · 3.5mm · 0.47mm/px · 3 of 13 slices shown]
[im 2/13]
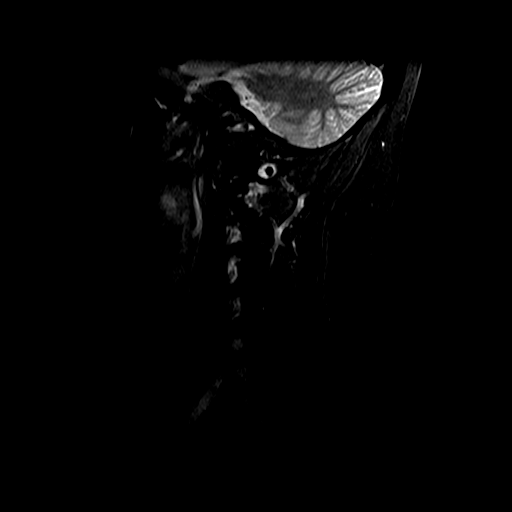
[im 7/13]
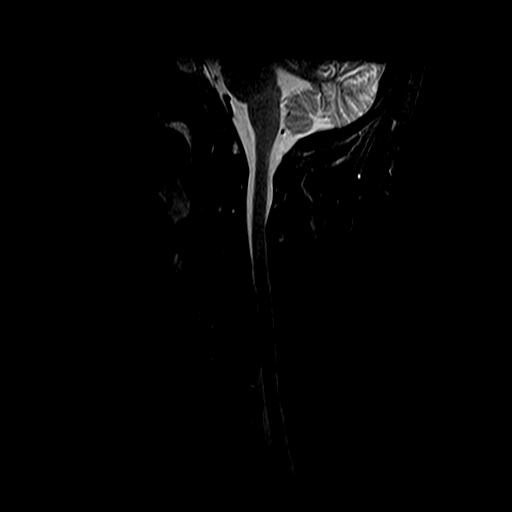
[im 11/13]
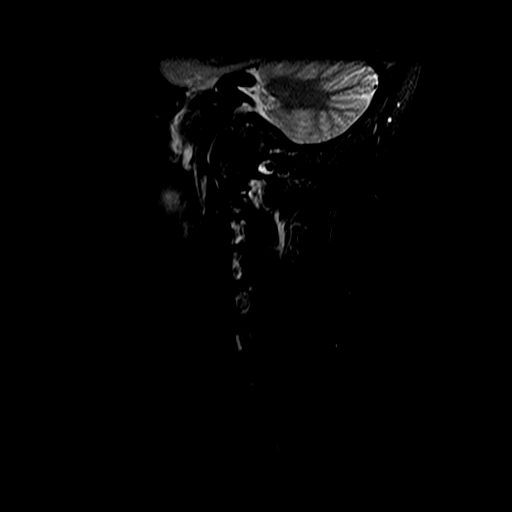

[Series 12: T2 · axial · 3.0mm · 0.39mm/px · z∈[-78,+7]mm · 9 of 18 slices shown (2 of 2)]
[im 1/18]
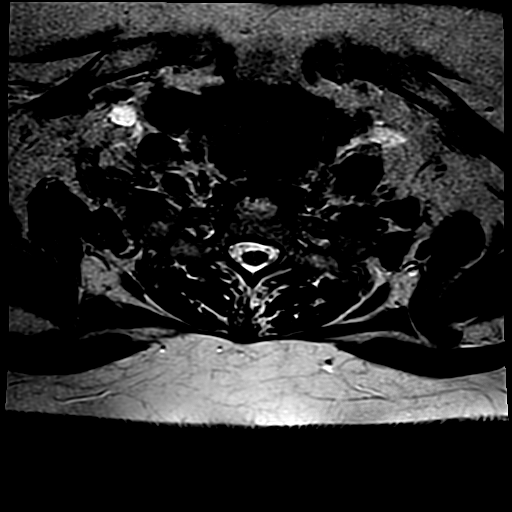
[im 4/18]
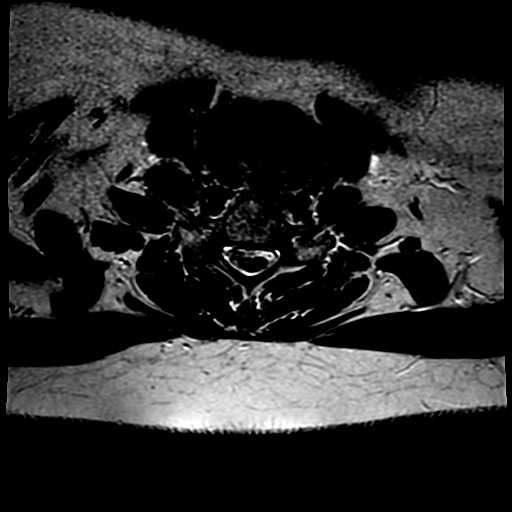
[im 5/18]
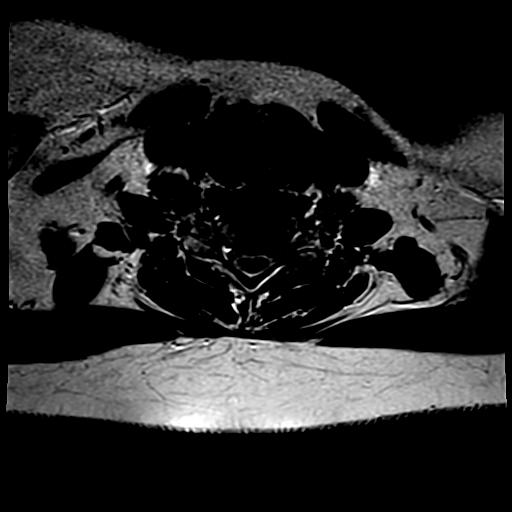
[im 8/18]
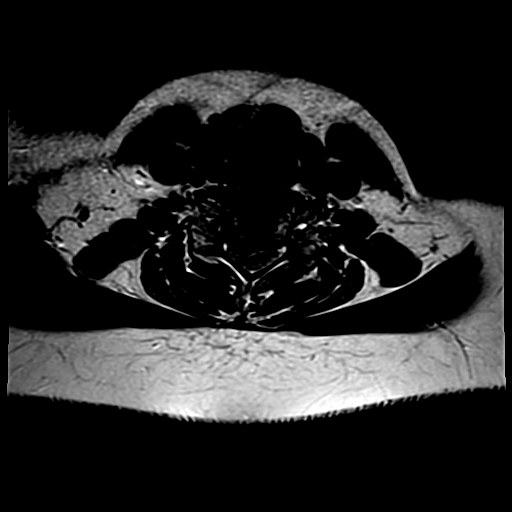
[im 10/18]
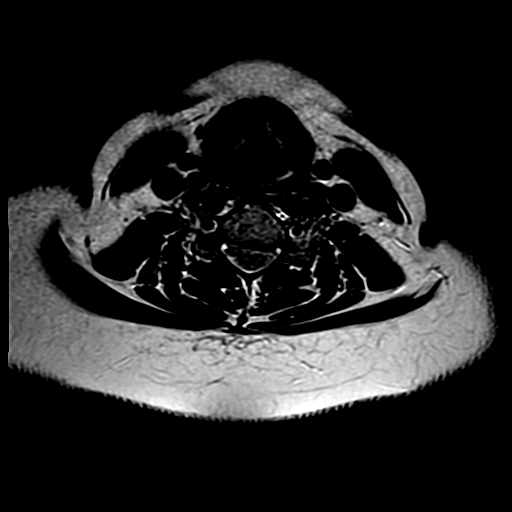
[im 13/18]
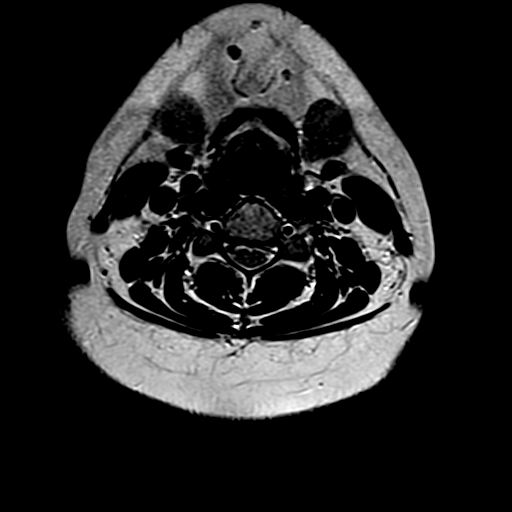
[im 14/18]
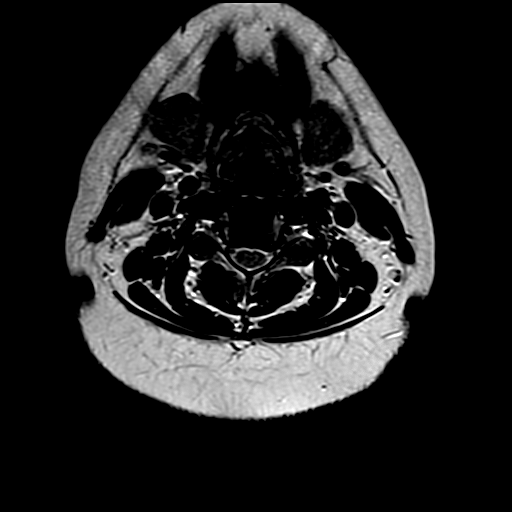
[im 16/18]
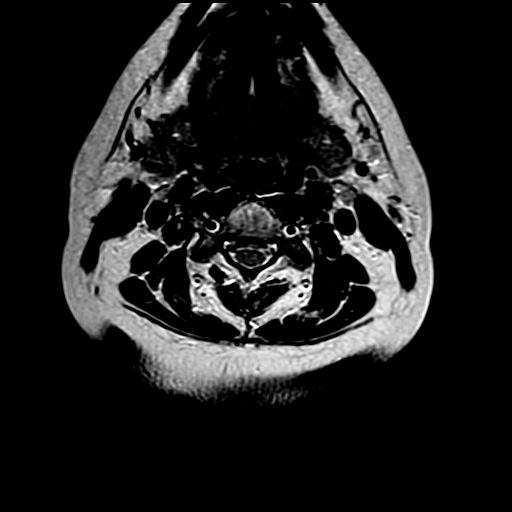
[im 18/18]
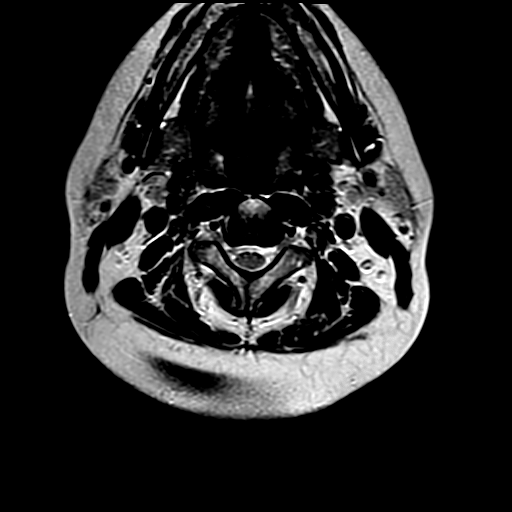

[24 of 48 positions shown; findings below may reference images not displayed]

FINDINGS: There are moderate degenerative changes at C5-C6 and C6-C7 with disc space narrowing and osteophyte formation.  Mild degenerative change is noted at C7-T1.  

There is no fracture or malalignment.  Minor degenerative marrow signal alteration is noted.  There is no Chiari malformation.  

There is mild to moderate narrowing of multiple neural foramina.  

C2-C3, C3-C4, and C4-C5 appear otherwise unremarkable.  

At C5-C6, there is a moderate sized right paracentral disc herniation with slight compression of the right side of the spinal cord.  However, no abnormal spinal cord signal is seen to suggest myelopathy at this time. 

At C6-C7, there is a small disc-osteophyte complex with slight compression of the thecal sac but no spinal cord compression.  

At C7-T1, there is mild disc bulging with slight compression of the thecal sac.
IMPRESSION: C5-C6 disc herniation.

## 2023-06-21 IMAGING — MR MRI LUMBAR SPINE WITHOUT CONTRAST
5 of 6 series · 33 of 48 positions shown · non-contrast
Comparison: None available.

﻿EXAM:  [id number]   MRI LUMBAR SPINE WITHOUT CONTRAST
INDICATION: Lumbar radiculopathy.
TECHNIQUE: Noncontrast multiplanar, multisequence MRI was performed.

[Series 5: T2 · sagittal · 4.5mm · 0.94mm/px · 6 of 13 slices shown (1 of 3)]
[im 1/13]
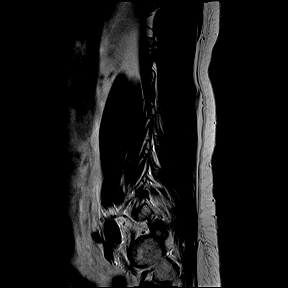
[im 3/13]
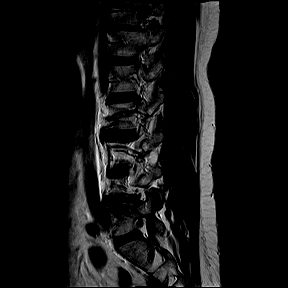
[im 5/13]
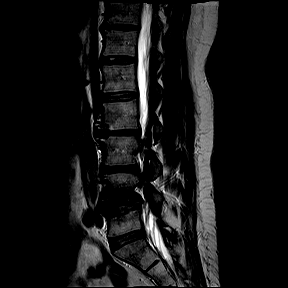
[im 8/13]
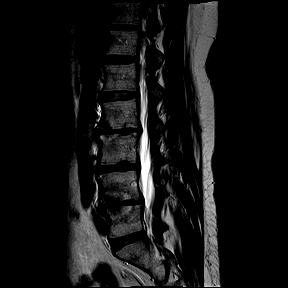
[im 10/13]
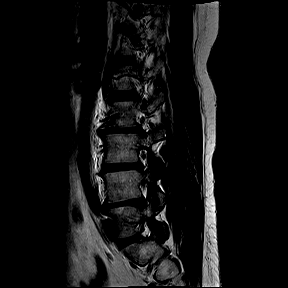
[im 13/13]
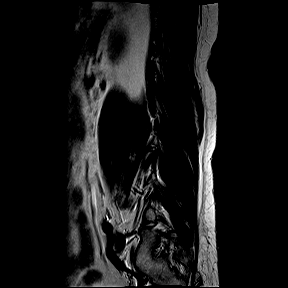

[Series 6: T1 · sagittal · 4.5mm · 0.94mm/px · 6 of 13 slices shown (1 of 2)]
[im 1/13]
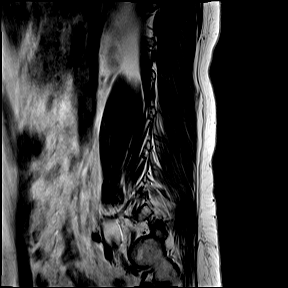
[im 3/13]
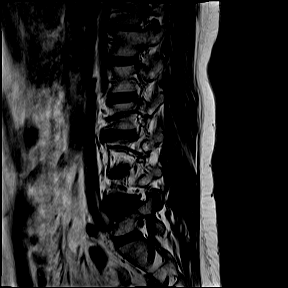
[im 5/13]
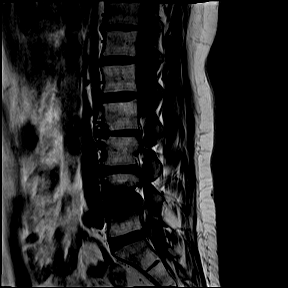
[im 8/13]
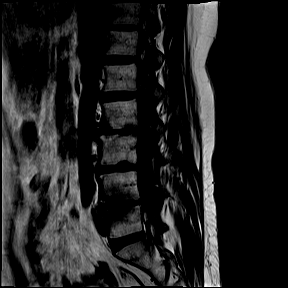
[im 10/13]
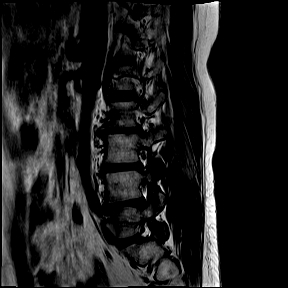
[im 13/13]
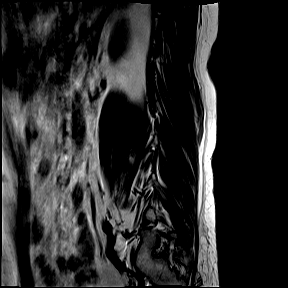

[Series 8: T2 · coronal · 5.5mm · 0.82mm/px · 8 of 18 slices shown (2 of 3)]
[im 1/18]
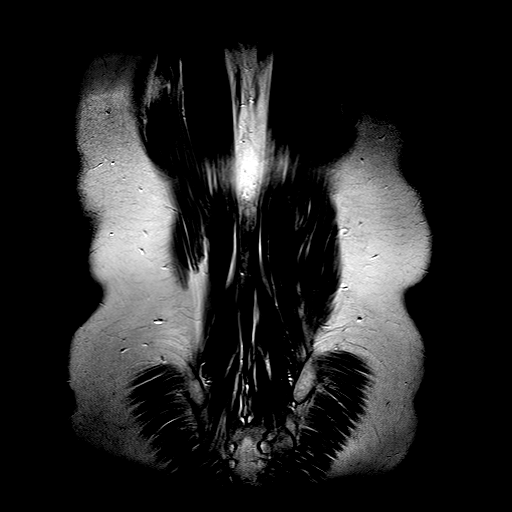
[im 3/18]
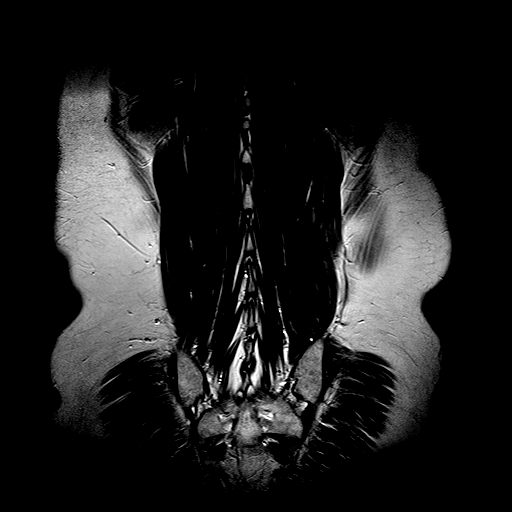
[im 5/18]
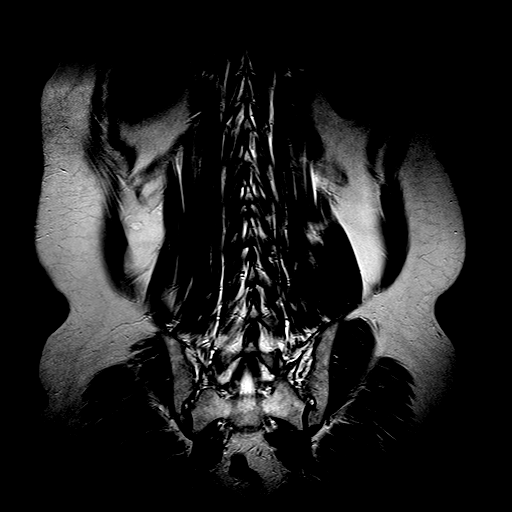
[im 8/18]
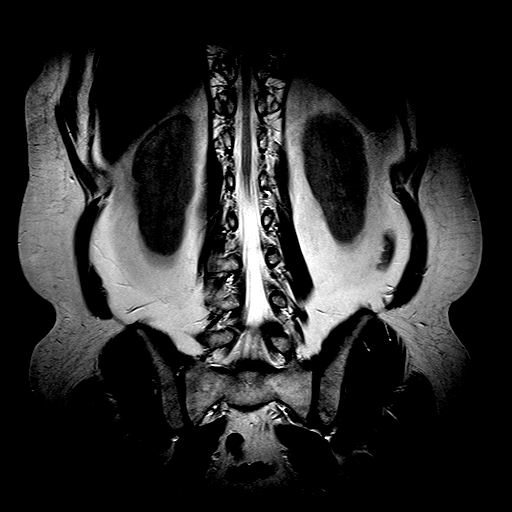
[im 10/18]
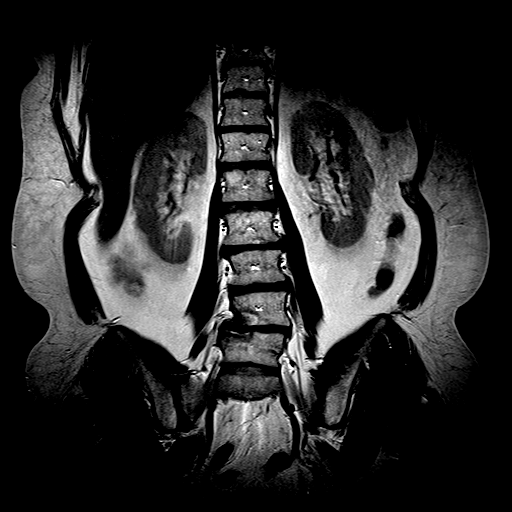
[im 13/18]
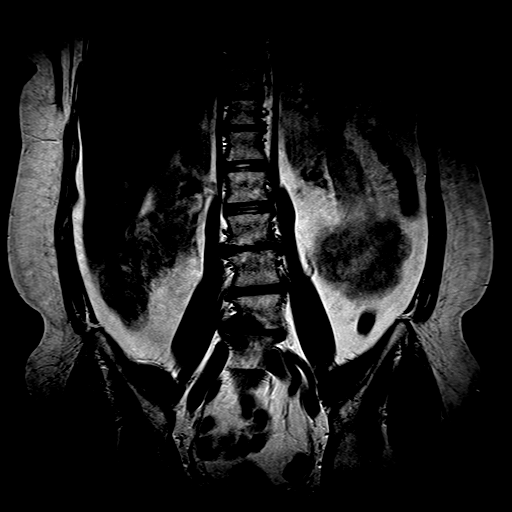
[im 15/18]
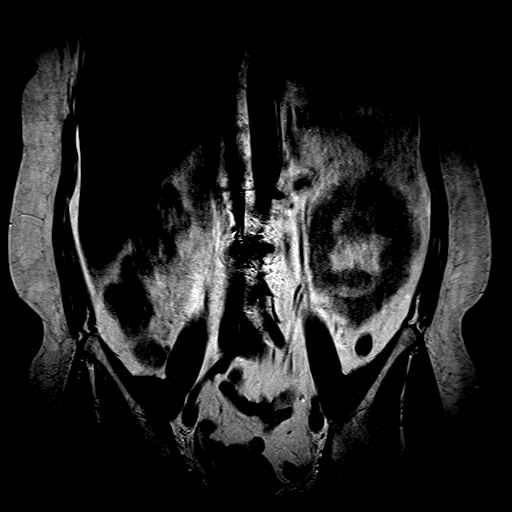
[im 18/18]
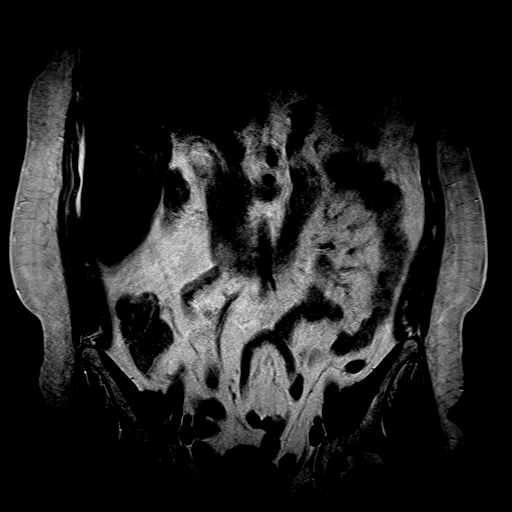

[Series 9: T2 · axial · 4.0mm · 0.52mm/px · z∈[-108,+101]mm · 11 of 25 slices shown (3 of 3)]
[im 1/25]
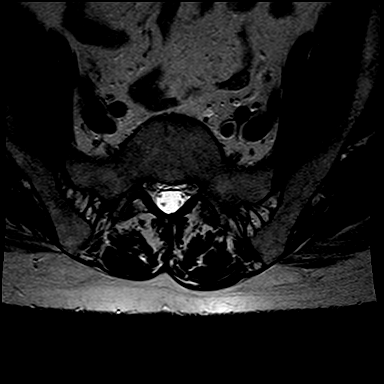
[im 3/25]
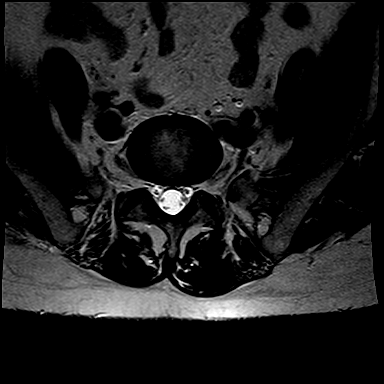
[im 5/25]
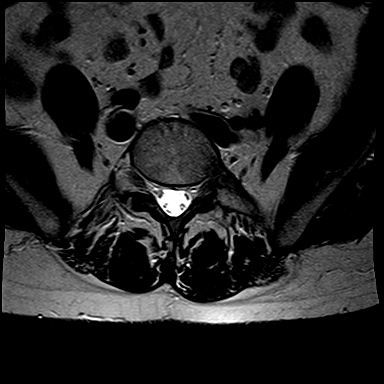
[im 8/25]
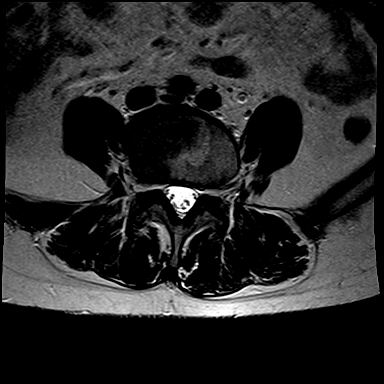
[im 10/25]
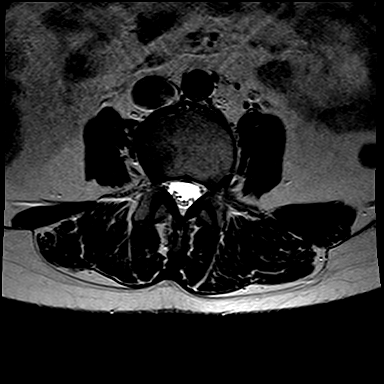
[im 13/25]
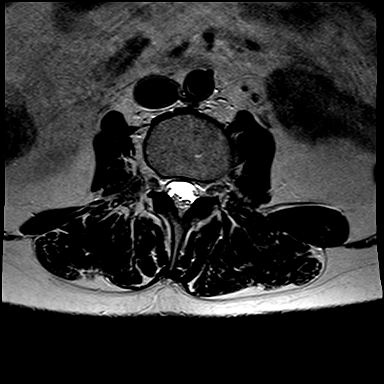
[im 15/25]
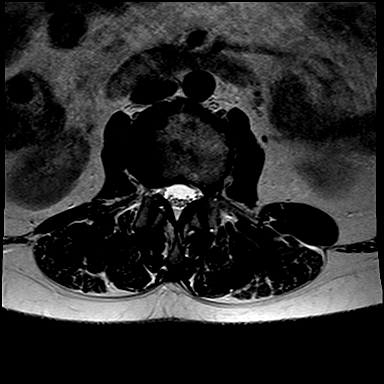
[im 17/25]
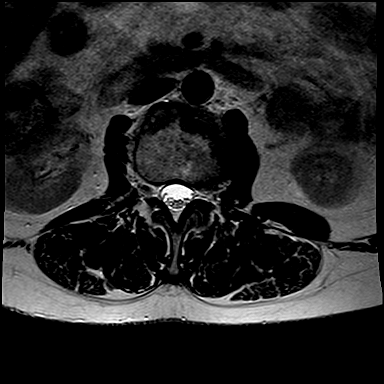
[im 20/25]
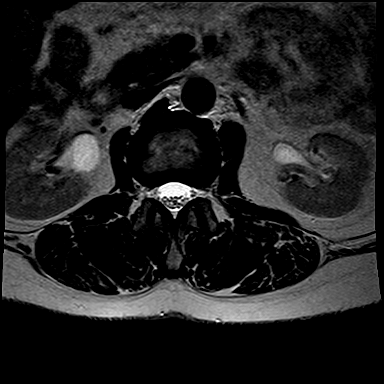
[im 22/25]
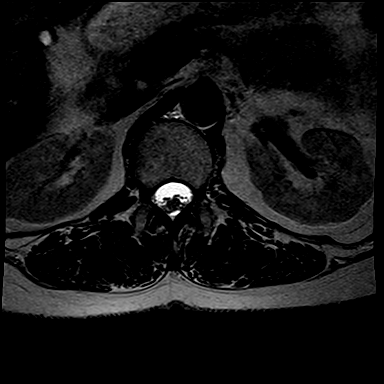
[im 25/25]
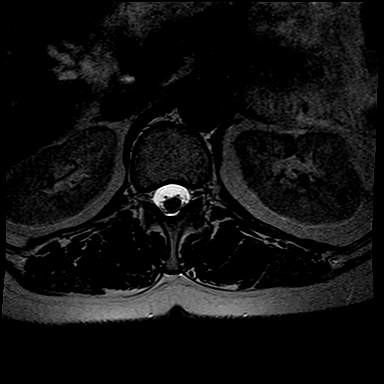

[Series 10: T1 · axial · 4.0mm · 0.52mm/px · z∈[-108,-89]mm · 2 of 25 slices shown (2 of 2)]
[im 1/25]
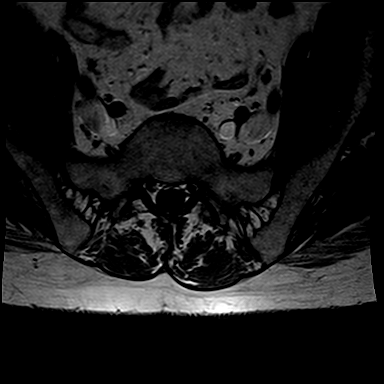
[im 5/25]
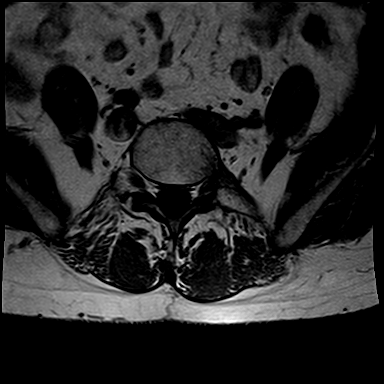

[33 of 48 positions shown; findings below may reference images not displayed]

FINDINGS: There is mild levoconvex scoliosis.  

There are moderately severe degenerative changes at L4-L5 with disc space narrowing, osteophyte formation, degenerative marrow signal alteration, and mild left lateral subluxation of L4 on L5.  Mild to moderate degenerative changes are noted at multiple levels elsewhere. 

There is mild disc bulging at L2-L3 and L4-L5.  No disc herniation is seen.  There is no significant spinal stenosis.  

No fracture is seen.  The conus appears unremarkable.
IMPRESSION: 1. Moderately severe L4-L5 degenerative disc disease. 

2. Mild disc bulging.  

3. No fracture, disc herniation, or spinal stenosis.
